# Patient Record
Sex: Female | Born: 1990 | Race: White | Hispanic: No | Marital: Married | Smoking: Current every day smoker
Health system: Southern US, Community
[De-identification: ages and names within clinical notes are randomized; demographics above are authoritative.]

## PROBLEM LIST (undated history)

## (undated) DIAGNOSIS — N750 Cyst of Bartholin's gland: Secondary | ICD-10-CM

## (undated) DIAGNOSIS — Z8709 Personal history of other diseases of the respiratory system: Secondary | ICD-10-CM

## (undated) DIAGNOSIS — J45909 Unspecified asthma, uncomplicated: Secondary | ICD-10-CM

## (undated) HISTORY — PX: TONSILLECTOMY: SUR1361

---

## 2012-03-23 ENCOUNTER — Encounter: Payer: Self-pay | Admitting: Physician Assistant

## 2012-03-23 ENCOUNTER — Ambulatory Visit: Payer: Self-pay | Admitting: Physician Assistant

## 2012-03-23 ENCOUNTER — Other Ambulatory Visit: Payer: Self-pay | Admitting: Internal Medicine

## 2012-03-23 VITALS — BP 119/69 | HR 92 | Temp 98.3°F | Resp 16 | Ht 65.0 in | Wt 147.8 lb

## 2012-03-23 DIAGNOSIS — Z01419 Encounter for gynecological examination (general) (routine) without abnormal findings: Secondary | ICD-10-CM

## 2012-03-23 MED ORDER — NORGESTIM-ETH ESTRAD TRIPHASIC 0.18/0.215/0.25 MG-35 MCG PO TABS: 1.0000 | ORAL_TABLET | Freq: Every day | ORAL | Status: DC

## 2012-03-24 NOTE — Progress Notes (Signed)
  Subjective:    Patient ID: Connie Buchanan, female    DOB: [birth year], 21 y.o.   MRN: [id number]  HPI 21yo CF here for pap. No health problems.  Doesn't do SBE.   Review of Systems  All other systems reviewed and are negative.       Objective:   Physical Exam  Nursing note and vitals reviewed. Constitutional: She is oriented to person, place, and time. She appears well-developed and well-nourished.  HENT:  Head: Normocephalic and atraumatic.  Right Ear: External ear normal.  Left Ear: External ear normal.  Neck: Normal range of motion. Neck supple. No thyromegaly present.  Cardiovascular: Normal rate, regular rhythm and normal heart sounds.  Exam reveals no gallop and no friction rub.   No murmur heard. Pulmonary/Chest: Effort normal and breath sounds normal. Right breast exhibits no inverted nipple, no mass, no nipple discharge, no skin change and no tenderness. Left breast exhibits no inverted nipple, no mass, no nipple discharge, no skin change and no tenderness. Breasts are symmetrical.  Abdominal: Soft. Bowel sounds are normal.  Genitourinary: Vagina normal and uterus normal.       Pap taken, bimanual unremarkable  Lymphadenopathy:    She has no cervical adenopathy.  Neurological: She is alert and oriented to person, place, and time.  Skin: Skin is warm and dry.          Assessment & Plan:  Gyn exam-taught and encouraged SBE.  Pap#3 sent.

## 2012-03-28 LAB — PAP IG, CT-NG, RFX HPV ASCU: GC Probe Amp: NEGATIVE

## 2013-05-24 ENCOUNTER — Other Ambulatory Visit: Payer: Self-pay | Admitting: Physician Assistant

## 2013-06-16 ENCOUNTER — Ambulatory Visit (INDEPENDENT_AMBULATORY_CARE_PROVIDER_SITE_OTHER): Payer: PRIVATE HEALTH INSURANCE | Admitting: Internal Medicine

## 2013-06-16 VITALS — BP 110/68 | HR 71 | Temp 98.0°F | Resp 16 | Ht 65.0 in | Wt 133.0 lb

## 2013-06-16 DIAGNOSIS — Z0000 Encounter for general adult medical examination without abnormal findings: Secondary | ICD-10-CM

## 2013-06-16 DIAGNOSIS — Z309 Encounter for contraceptive management, unspecified: Secondary | ICD-10-CM

## 2013-06-16 MED ORDER — NORGESTIM-ETH ESTRAD TRIPHASIC 0.18/0.215/0.25 MG-35 MCG PO TABS: 1.0000 | ORAL_TABLET | Freq: Every day | ORAL | Status: DC

## 2013-06-16 NOTE — Progress Notes (Signed)
  Subjective:    Patient ID: Connie Buchanan, female    DOB: 06-21-1991, 22 y.o.   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 No health issues Immunizations up to date Smokes occas/no illegal drugs Has had 2 normal Pap smears in the last 4 years Good relationship with parents  Family history father with obesity related diabetes Grandmother with breast cancer  She says her immunizations are up to date  Review of Systems Review of systems negative except occasional headaches which are monthly or less and do not interfere with activity/no vision changes    Objective:   Physical Exam BP 110/68  Pulse 71  Temp(Src) 98 F (36.7 C) (Oral)  Resp 16  Ht 5\' 5"  (1.651 m)  Wt 133 lb (60.328 kg)  BMI 22.13 kg/m2  SpO2 100%  LMP 05/28/2013 HEENT clear Heart regular Lungs clear Abdomen supple without organomegaly Extremities clear Skin clear except left axilla has pedunculated nevus Neurological clear Mood good affect appropriate     Assessment & Plan:  Annual health assessment is stable She is advised to discontinue smoking She does not need another Pap smear until 2016 She can continue birth control pills She is afraid to have the mole removed She does not wish to have blood work done

## 2014-05-16 ENCOUNTER — Other Ambulatory Visit: Payer: Self-pay | Admitting: Internal Medicine

## 2014-05-25 ENCOUNTER — Ambulatory Visit (INDEPENDENT_AMBULATORY_CARE_PROVIDER_SITE_OTHER): Payer: No Typology Code available for payment source | Admitting: Emergency Medicine

## 2014-05-25 VITALS — BP 116/68 | HR 80 | Temp 98.3°F | Resp 18 | Ht 64.0 in | Wt 146.2 lb

## 2014-05-25 DIAGNOSIS — M2390 Unspecified internal derangement of unspecified knee: Secondary | ICD-10-CM

## 2014-05-25 MED ORDER — NAPROXEN SODIUM 550 MG PO TABS: 550.0000 mg | ORAL_TABLET | Freq: Two times a day (BID) | ORAL | Status: AC

## 2014-05-25 MED ORDER — ACETAMINOPHEN-CODEINE #3 300-30 MG PO TABS: 1.0000 | ORAL_TABLET | ORAL | Status: DC | PRN

## 2014-05-25 NOTE — Progress Notes (Signed)
[facility] [address], [place] [address] [phone]  Date:  05/25/2014   Name:  Connie SpruceCaitlin Buchanan   DOB:  10/21/1991   MRN:  [id number]  PCP:  No PCP Per Patient    Chief Complaint: Knee Pain   History of Present Illness:  Connie SpruceCaitlin Lovick is a 23 y.o. very pleasant female patient who presents with the following:  History of long term pain in right knee under care of ortho.  She went tubing yesterday and after twisted her knee to "pop" it and relieve pressure and now it  Won't "pop" back to it's "normal" position.   She is unable to pivot on that foot and cannot flex her knee.  She has moderate swelling and is having pain.  No improvement with over the counter medications or other home remedies. Denies other complaint or health concern today.   There are no active problems to display for this patient.   History reviewed. No pertinent past medical history.  Past Surgical History  Procedure Laterality Date  . Tonsillectomy      History  Substance Use Topics  . Smoking status: Current Some Day Smoker  . Smokeless tobacco: Not on file  . Alcohol Use: No    History reviewed. No pertinent family history.  No Known Allergies  Medication list has been reviewed and updated.  Current Outpatient Prescriptions on File Prior to Visit  Medication Sig Dispense Refill  . Norgestimate-Ethinyl Estradiol Triphasic (TRI-SPRINTEC) 0.18/0.215/0.25 MG-35 MCG tablet Take 1 tablet by mouth daily. PATIENT NEEDS OFFICE VISIT FOR ADDITIONAL REFILLS  28 tablet  0   No current facility-administered medications on file prior to visit.    Review of Systems:  As per HPI, otherwise negative.    Physical Examination: Filed Vitals:   05/25/14 1651  BP: 116/68  Pulse: 80  Temp: 98.3 F (36.8 C)  Resp: 18   Filed Vitals:   05/25/14 1651  Height: 5\' 4"  (1.626 m)  Weight: 146 lb 3.2 oz (66.316 kg)   Body mass index is 25.08 kg/(m^2). Ideal Body Weight: Weight  in (lb) to have BMI = 25: 145.3   GEN: WDWN, NAD, Non-toxic, Alert & Oriented x 3 HEENT: Atraumatic, Normocephalic.  Ears and Nose: No external deformity. EXTR: No clubbing/cyanosis/edema NEURO: Normal gait.  PSYCH: Normally interactive. Conversant. Not depressed or anxious appearing.  Calm demeanor.  RIGHT knee: moderate effusion.  Tender medial knee.  Too guardy to evaluate stability.  Full extension; 75 degrees of flexion.  Assessment and Plan: Internal derangement knee Crutches Knee immobilizer MRI Ortho follow up  tyl #3 anaprox Signed,  Phillips OdorJeffery Jagger Demonte, MD

## 2014-05-25 NOTE — Patient Instructions (Signed)

## 2014-06-14 ENCOUNTER — Inpatient Hospital Stay: Admission: RE | Admit: 2014-06-14 | Payer: Self-pay | Source: Ambulatory Visit

## 2014-06-20 ENCOUNTER — Other Ambulatory Visit: Payer: Self-pay | Admitting: Physician Assistant

## 2014-06-20 ENCOUNTER — Other Ambulatory Visit: Payer: Self-pay | Admitting: Internal Medicine

## 2014-07-17 ENCOUNTER — Other Ambulatory Visit: Payer: Self-pay | Admitting: Physician Assistant

## 2014-07-20 ENCOUNTER — Ambulatory Visit (INDEPENDENT_AMBULATORY_CARE_PROVIDER_SITE_OTHER): Payer: No Typology Code available for payment source | Admitting: Internal Medicine

## 2014-07-20 VITALS — BP 110/70 | HR 85 | Temp 98.2°F | Resp 16 | Ht 65.5 in | Wt 147.0 lb

## 2014-07-20 DIAGNOSIS — Z3041 Encounter for surveillance of contraceptive pills: Secondary | ICD-10-CM

## 2014-07-20 DIAGNOSIS — Z3009 Encounter for other general counseling and advice on contraception: Secondary | ICD-10-CM

## 2014-07-20 MED ORDER — NORGESTIM-ETH ESTRAD TRIPHASIC 0.18/0.215/0.25 MG-35 MCG PO TABS: 1.0000 | ORAL_TABLET | Freq: Every day | ORAL | Status: DC

## 2014-07-20 NOTE — Progress Notes (Signed)
   Subjective:  This chart was scribed for Ellamae Siaobert Melaine Mcphee, MD by Bronson CurbJacqueline Melvin, ED Scribe. This patient was seen in room Room/bed 14 and the patient's care was started at 10:58 AM.   Patient ID: Connie Buchanan, female    DOB: 05/13/1991, 23 y.o.   MRN: [id number]  HPI  HPI Comments: Connie Buchanan is a 23 y.o. female who [facility] on [facility] pills. Patient states she has a new sexual partner, and reports using female condoms in addition to her  birth control pills. She states she has had two partners in this past year. Patient does not wish to be screened for chlamydia/gonorrhea. Unsure HPV//next pap 2016  Review of Systems  Constitutional: Negative for fever and chills.       Objective:   Physical Exam  Nursing note and vitals reviewed. Constitutional: She is oriented to person, place, and time. She appears well-developed and well-nourished. No distress.  HENT:  Head: Normocephalic and atraumatic.  Eyes: Conjunctivae and EOM are normal.  Neck: Neck supple. No tracheal deviation present.  Cardiovascular: Normal rate.   Pulmonary/Chest: Effort normal. No respiratory distress.  Musculoskeletal: Normal range of motion.  Neurological: She is alert and oriented to person, place, and time.  Skin: Skin is warm and dry.  Psychiatric: She has a normal mood and affect. Her behavior is normal.          Assessment & Plan:  Encounter for other general counseling or advice on contraception  Encounter for birth control pills maintenance  Meds ordered this encounter  Medications  . Norgestimate-Ethinyl Estradiol Triphasic (TRI-SPRINTEC) 0.18/0.215/0.25 MG-35 MCG tablet    Sig: Take 1 tablet by mouth daily.    Dispense:  1 Package    Refill:  11      I have completed the patient encounter in its entirety as documented by the scribe, with editing by me where necessary. Arad Burston P. Merla Richesoolittle, M.D.

## 2015-05-25 ENCOUNTER — Encounter (HOSPITAL_COMMUNITY): Payer: Self-pay | Admitting: *Deleted

## 2015-05-25 DIAGNOSIS — Y92410 Unspecified street and highway as the place of occurrence of the external cause: Secondary | ICD-10-CM | POA: Insufficient documentation

## 2015-05-25 DIAGNOSIS — S0990XA Unspecified injury of head, initial encounter: Secondary | ICD-10-CM | POA: Insufficient documentation

## 2015-05-25 DIAGNOSIS — Y9389 Activity, other specified: Secondary | ICD-10-CM | POA: Insufficient documentation

## 2015-05-25 DIAGNOSIS — Z793 Long term (current) use of hormonal contraceptives: Secondary | ICD-10-CM | POA: Insufficient documentation

## 2015-05-25 DIAGNOSIS — Y998 Other external cause status: Secondary | ICD-10-CM | POA: Diagnosis not present

## 2015-05-25 DIAGNOSIS — Z720 Tobacco use: Secondary | ICD-10-CM | POA: Diagnosis not present
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
## 2015-05-26 ENCOUNTER — Emergency Department (HOSPITAL_COMMUNITY)
Admission: EM | Admit: 2015-05-26 | Discharge: 2015-05-26 | Disposition: A | Discharge status: Home / Self Care | Payer: No Typology Code available for payment source | Attending: Emergency Medicine | Admitting: Emergency Medicine

## 2015-05-26 MED ORDER — IBUPROFEN 400 MG PO TABS
800.0000 mg | ORAL_TABLET | Freq: Once | ORAL | Status: AC
  Administered 2015-05-26: 800 mg via ORAL
  Filled 2015-05-26: qty 2

## 2015-05-26 MED ORDER — IBUPROFEN 800 MG PO TABS: 800.0000 mg | ORAL_TABLET | Freq: Three times a day (TID) | ORAL | Status: DC

## 2015-05-26 NOTE — Discharge Instructions (Signed)
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
## 2015-05-31 ENCOUNTER — Ambulatory Visit (INDEPENDENT_AMBULATORY_CARE_PROVIDER_SITE_OTHER): Payer: No Typology Code available for payment source | Admitting: Emergency Medicine

## 2015-05-31 VITALS — BP 118/62 | HR 95 | Temp 98.6°F | Resp 18 | Ht 66.0 in | Wt 153.0 lb

## 2015-05-31 DIAGNOSIS — G44209 Tension-type headache, unspecified, not intractable: Secondary | ICD-10-CM | POA: Diagnosis not present

## 2015-05-31 LAB — POCT URINE PREGNANCY: Preg Test, Ur: NEGATIVE

## 2015-05-31 MED ORDER — CYCLOBENZAPRINE HCL 5 MG PO TABS: 5.0000 mg | ORAL_TABLET | Freq: Every evening | ORAL | Status: DC | PRN

## 2015-05-31 MED ORDER — NAPROXEN 500 MG PO TABS: 500.0000 mg | ORAL_TABLET | Freq: Two times a day (BID) | ORAL | Status: DC
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
## 2015-06-05 ENCOUNTER — Other Ambulatory Visit: Payer: No Typology Code available for payment source

## 2015-06-13 ENCOUNTER — Ambulatory Visit (INDEPENDENT_AMBULATORY_CARE_PROVIDER_SITE_OTHER): Payer: No Typology Code available for payment source | Admitting: Family Medicine

## 2015-06-13 VITALS — BP 118/68 | HR 88 | Temp 98.8°F | Resp 18 | Ht 64.5 in | Wt 149.6 lb

## 2015-06-13 DIAGNOSIS — Z3041 Encounter for surveillance of contraceptive pills: Secondary | ICD-10-CM

## 2015-06-13 DIAGNOSIS — Z124 Encounter for screening for malignant neoplasm of cervix: Secondary | ICD-10-CM | POA: Diagnosis not present

## 2015-06-13 DIAGNOSIS — Z720 Tobacco use: Secondary | ICD-10-CM | POA: Insufficient documentation

## 2015-06-13 MED ORDER — NORGESTIM-ETH ESTRAD TRIPHASIC 0.18/0.215/0.25 MG-35 MCG PO TABS: 1.0000 | ORAL_TABLET | Freq: Every day | ORAL | Status: DC

## 2015-06-13 NOTE — Patient Instructions (Signed)
Good to see you today! I will be in touch with your pap asap Continue to take your pill daily Remember that smoking while on birth control pills can increase your risk of a stroke or a blood clot.   Please try to quit smoking.   If you are not able to quit you may want to consider a progesterone only method such as nexplanon, the IUD or a progesterone only pill

## 2015-06-13 NOTE — Progress Notes (Signed)
[facility] [address], [place] [address] [phone]  Date:  06/13/2015   Name:  Connie Buchanan   DOB:  08/11/1991   MRN:  [id number]  PCP:  No PCP Per Patient    Chief Complaint: Gynecologic Exam   History of Present Illness:  Connie Buchanan is a 24 y.o. very pleasant female patient who presents with the following:  Generally healthy young lady here today to discuss birth control Last pap about 3 years ago- no history of abnormal She is ok with her current OCP She is an occasional smoker- she might smoke 1 pack a week or a pack a day No history of DVT or PE, no history of cancer.   She does not get migraine headache   There are no active problems to display for this patient.   History reviewed. No pertinent past medical history.  Past Surgical History  Procedure Laterality Date  . Tonsillectomy      History  Substance Use Topics  . Smoking status: Current Some Day Smoker  . Smokeless tobacco: Not on file  . Alcohol Use: No    History reviewed. No pertinent family history.  No Known Allergies  Medication list has been reviewed and updated.  Current Outpatient Prescriptions on File Prior to Visit  Medication Sig Dispense Refill  . acetaminophen-codeine (TYLENOL #3) 300-30 MG per tablet Take 1-2 tablets by mouth every 4 (four) hours as needed. 30 tablet 0  . cyclobenzaprine (FLEXERIL) 5 MG tablet Take 1 tablet (5 mg total) by mouth at bedtime as needed for muscle spasms. 30 tablet 0  . naproxen (NAPROSYN) 500 MG tablet Take 1 tablet (500 mg total) by mouth 2 (two) times daily with a meal. 30 tablet 0  . Norgestimate-Ethinyl Estradiol Triphasic (TRI-SPRINTEC) 0.18/0.215/0.25 MG-35 MCG tablet Take 1 tablet by mouth daily. 1 Package 11  . ibuprofen (ADVIL,MOTRIN) 800 MG tablet Take 1 tablet (800 mg total) by mouth 3 (three) times daily. (Patient not taking: Reported on 05/31/2015) 21 tablet 0   No current facility-administered  medications on file prior to visit.    Review of Systems:  As per HPI- otherwise negative.   Physical Examination: Filed Vitals:   06/13/15 1514  BP: 118/68  Pulse: 88  Temp: 98.8 F (37.1 C)  Resp: 18   Filed Vitals:   06/13/15 1514  Height: 5' 4.5" (1.638 m)  Weight: 149 lb 9.6 oz (67.858 kg)   Body mass index is 25.29 kg/(m^2). Ideal Body Weight: Weight in (lb) to have BMI = 25: 147.6  GEN: WDWN, NAD, Non-toxic, A & O x 3,looks well HEENT: Atraumatic, Normocephalic. Neck supple. No masses, No LAD. Ears and Nose: No external deformity. CV: RRR, No M/G/R. No JVD. No thrill. No extra heart sounds. PULM: CTA B, no wheezes, crackles, rhonchi. No retractions. No resp. distress. No accessory muscle use. ABD: S, NT, ND EXTR: No c/c/e NEURO Normal gait.  PSYCH: Normally interactive. Conversant. Not depressed or anxious appearing.  Calm demeanor.  Pelvic: normal, no vaginal lesions or discharge. Uterus normal, no CMT, no adnexal tendereness or masses    Assessment and Plan: Screening for cervical cancer - Plan: Pap IG, CT/NG w/ reflex HPV when ASC-U  Oral contraceptive pill surveillance - Plan: Norgestimate-Ethinyl Estradiol Triphasic (TRI-SPRINTEC) 0.18/0.215/0.25 MG-35 MCG tablet  Refilled her OCP, discussed increased risk of CVA/ DVT with tobacco use and encouraged her to quit   Signed Abbe AmsterdamJessica Erna Brossard, MD

## 2015-06-15 LAB — PAP IG, CT-NG, RFX HPV ASCU
CHLAMYDIA PROBE AMP: NEGATIVE
GC Probe Amp: NEGATIVE

## 2015-06-16 ENCOUNTER — Encounter: Payer: Self-pay | Admitting: Family Medicine

## 2016-06-18 ENCOUNTER — Other Ambulatory Visit: Payer: Self-pay | Admitting: Family Medicine

## 2016-06-19 NOTE — Telephone Encounter (Signed)
Tried to contact pt to inform her that her Tri-Sprintec rx has been filled but she will need to make an appt prior to any further refills. Left message for pt to call back to schedule appt.

## 2017-11-09 LAB — OB RESULTS CONSOLE GC/CHLAMYDIA
CHLAMYDIA, DNA PROBE: NEGATIVE
GC PROBE AMP, GENITAL: NEGATIVE

## 2017-11-09 LAB — OB RESULTS CONSOLE HIV ANTIBODY (ROUTINE TESTING): HIV: NONREACTIVE

## 2017-11-09 LAB — OB RESULTS CONSOLE ABO/RH: RH Type: POSITIVE

## 2017-11-09 LAB — OB RESULTS CONSOLE RUBELLA ANTIBODY, IGM: RUBELLA: IMMUNE

## 2017-11-09 LAB — OB RESULTS CONSOLE RPR: RPR: NONREACTIVE

## 2017-11-09 LAB — OB RESULTS CONSOLE HEPATITIS B SURFACE ANTIGEN: HEP B S AG: NEGATIVE

## 2017-11-09 LAB — OB RESULTS CONSOLE ANTIBODY SCREEN: Antibody Screen: NEGATIVE

## 2017-11-18 ENCOUNTER — Other Ambulatory Visit (HOSPITAL_COMMUNITY)
Admission: RE | Admit: 2017-11-18 | Discharge: 2017-11-18 | Disposition: A | Discharge status: Home / Self Care | Payer: Managed Care, Other (non HMO) | Source: Ambulatory Visit | Attending: Obstetrics and Gynecology | Admitting: Obstetrics and Gynecology

## 2017-11-18 ENCOUNTER — Other Ambulatory Visit: Payer: Self-pay | Admitting: Obstetrics and Gynecology

## 2017-11-18 DIAGNOSIS — Z124 Encounter for screening for malignant neoplasm of cervix: Secondary | ICD-10-CM | POA: Diagnosis not present

## 2017-11-19 LAB — CYTOLOGY - PAP
Chlamydia: NEGATIVE
DIAGNOSIS: NEGATIVE
NEISSERIA GONORRHEA: NEGATIVE

## 2018-05-16 LAB — OB RESULTS CONSOLE GBS: GBS: NEGATIVE

## 2018-06-14 ENCOUNTER — Telehealth (HOSPITAL_COMMUNITY): Payer: Self-pay | Admitting: *Deleted

## 2018-06-14 ENCOUNTER — Encounter (HOSPITAL_COMMUNITY): Payer: Self-pay | Admitting: *Deleted

## 2018-06-15 NOTE — Telephone Encounter (Signed)
Preadmission screen  

## 2018-06-16 ENCOUNTER — Telehealth (HOSPITAL_COMMUNITY): Payer: Self-pay | Admitting: *Deleted

## 2018-06-16 ENCOUNTER — Encounter (HOSPITAL_COMMUNITY): Payer: Self-pay | Admitting: *Deleted

## 2018-06-16 NOTE — Telephone Encounter (Signed)
Preadmission screen  

## 2018-06-17 ENCOUNTER — Inpatient Hospital Stay (HOSPITAL_COMMUNITY)
Admission: AD | Admit: 2018-06-17 | Discharge: 2018-06-21 | DRG: 788 | Disposition: A | Discharge status: Home / Self Care | Payer: Managed Care, Other (non HMO) | Attending: Obstetrics and Gynecology | Admitting: Obstetrics and Gynecology

## 2018-06-17 ENCOUNTER — Other Ambulatory Visit: Payer: Self-pay

## 2018-06-17 ENCOUNTER — Inpatient Hospital Stay (HOSPITAL_COMMUNITY): Payer: Managed Care, Other (non HMO) | Admitting: Anesthesiology

## 2018-06-17 ENCOUNTER — Encounter (HOSPITAL_COMMUNITY): Payer: Self-pay | Admitting: *Deleted

## 2018-06-17 DIAGNOSIS — O99334 Smoking (tobacco) complicating childbirth: Secondary | ICD-10-CM | POA: Diagnosis present

## 2018-06-17 DIAGNOSIS — F17210 Nicotine dependence, cigarettes, uncomplicated: Secondary | ICD-10-CM | POA: Diagnosis present

## 2018-06-17 DIAGNOSIS — O4292 Full-term premature rupture of membranes, unspecified as to length of time between rupture and onset of labor: Principal | ICD-10-CM | POA: Diagnosis present

## 2018-06-17 DIAGNOSIS — O324XX0 Maternal care for high head at term, not applicable or unspecified: Secondary | ICD-10-CM | POA: Diagnosis present

## 2018-06-17 DIAGNOSIS — Z3A40 40 weeks gestation of pregnancy: Secondary | ICD-10-CM | POA: Diagnosis not present

## 2018-06-17 HISTORY — DX: Unspecified asthma, uncomplicated: J45.909

## 2018-06-17 LAB — TYPE AND SCREEN
ABO/RH(D): A POS
ANTIBODY SCREEN: NEGATIVE

## 2018-06-17 LAB — CBC
HCT: 39.1 % (ref 36.0–46.0)
Hemoglobin: 13.4 g/dL (ref 12.0–15.0)
MCH: 30.7 pg (ref 26.0–34.0)
MCHC: 34.3 g/dL (ref 30.0–36.0)
MCV: 89.7 fL (ref 78.0–100.0)
Platelets: 302 10*3/uL (ref 150–400)
RBC: 4.36 MIL/uL (ref 3.87–5.11)
RDW: 13.7 % (ref 11.5–15.5)
WBC: 15.8 10*3/uL — AB (ref 4.0–10.5)

## 2018-06-17 LAB — ABO/RH: ABO/RH(D): A POS

## 2018-06-17 LAB — POCT FERN TEST: POCT Fern Test: POSITIVE

## 2018-06-17 MED ORDER — PHENYLEPHRINE 40 MCG/ML (10ML) SYRINGE FOR IV PUSH (FOR BLOOD PRESSURE SUPPORT): 80.0000 ug | PREFILLED_SYRINGE | INTRAVENOUS | Status: DC | PRN

## 2018-06-17 MED ORDER — LIDOCAINE HCL (PF) 1 % IJ SOLN
30.0000 mL | INTRAMUSCULAR | Status: DC | PRN
  Filled 2018-06-17: qty 30

## 2018-06-17 MED ORDER — EPHEDRINE 5 MG/ML INJ: 10.0000 mg | INTRAVENOUS | Status: DC | PRN

## 2018-06-17 MED ORDER — ONDANSETRON HCL 4 MG/2ML IJ SOLN
4.0000 mg | Freq: Four times a day (QID) | INTRAMUSCULAR | Status: DC | PRN
  Administered 2018-06-17: 4 mg via INTRAVENOUS
  Filled 2018-06-17: qty 2

## 2018-06-17 MED ORDER — ACETAMINOPHEN 325 MG PO TABS: 650.0000 mg | ORAL_TABLET | ORAL | Status: DC | PRN

## 2018-06-17 MED ORDER — FENTANYL CITRATE (PF) 100 MCG/2ML IJ SOLN
50.0000 ug | INTRAMUSCULAR | Status: DC | PRN
Start: 2018-06-17 — End: 2018-06-18
  Administered 2018-06-17: 100 ug via INTRAVENOUS
  Filled 2018-06-17: qty 2

## 2018-06-17 MED ORDER — OXYCODONE-ACETAMINOPHEN 5-325 MG PO TABS: 2.0000 | ORAL_TABLET | ORAL | Status: DC | PRN

## 2018-06-17 MED ORDER — LACTATED RINGERS IV SOLN
500.0000 mL | Freq: Once | INTRAVENOUS | Status: AC
  Administered 2018-06-17: 500 mL via INTRAVENOUS

## 2018-06-17 MED ORDER — TERBUTALINE SULFATE 1 MG/ML IJ SOLN: 0.2500 mg | Freq: Once | INTRAMUSCULAR | Status: DC | PRN

## 2018-06-17 MED ORDER — LIDOCAINE HCL (PF) 1 % IJ SOLN
INTRAMUSCULAR | Status: DC | PRN
  Administered 2018-06-17: 13 mL via EPIDURAL

## 2018-06-17 MED ORDER — DIPHENHYDRAMINE HCL 50 MG/ML IJ SOLN: 12.5000 mg | INTRAMUSCULAR | Status: DC | PRN

## 2018-06-17 MED ORDER — OXYTOCIN 40 UNITS IN LACTATED RINGERS INFUSION - SIMPLE MED: 2.5000 [IU]/h | INTRAVENOUS | Status: DC

## 2018-06-17 MED ORDER — FLEET ENEMA 7-19 GM/118ML RE ENEM: 1.0000 | ENEMA | RECTAL | Status: DC | PRN

## 2018-06-17 MED ORDER — OXYTOCIN 40 UNITS IN LACTATED RINGERS INFUSION - SIMPLE MED
1.0000 m[IU]/min | INTRAVENOUS | Status: DC
  Administered 2018-06-17: 2 m[IU]/min via INTRAVENOUS
  Filled 2018-06-17: qty 1000

## 2018-06-17 MED ORDER — LACTATED RINGERS IV SOLN: 500.0000 mL | INTRAVENOUS | Status: DC | PRN

## 2018-06-17 MED ORDER — OXYCODONE-ACETAMINOPHEN 5-325 MG PO TABS: 1.0000 | ORAL_TABLET | ORAL | Status: DC | PRN

## 2018-06-17 MED ORDER — MISOPROSTOL 50MCG HALF TABLET
50.0000 ug | ORAL_TABLET | Freq: Once | ORAL | Status: AC
  Administered 2018-06-17: 50 ug via ORAL
  Filled 2018-06-17: qty 1

## 2018-06-17 MED ORDER — FENTANYL 2.5 MCG/ML BUPIVACAINE 1/10 % EPIDURAL INFUSION (WH - ANES)
14.0000 mL/h | INTRAMUSCULAR | Status: DC | PRN
  Administered 2018-06-17 (×2): 14 mL/h via EPIDURAL
  Filled 2018-06-17 (×2): qty 100

## 2018-06-17 MED ORDER — PHENYLEPHRINE 40 MCG/ML (10ML) SYRINGE FOR IV PUSH (FOR BLOOD PRESSURE SUPPORT)
80.0000 ug | PREFILLED_SYRINGE | INTRAVENOUS | Status: DC | PRN
  Filled 2018-06-17: qty 10

## 2018-06-17 MED ORDER — LACTATED RINGERS IV SOLN
INTRAVENOUS | Status: DC
  Administered 2018-06-17: 1000 mL via INTRAVENOUS
  Administered 2018-06-17 – 2018-06-18 (×2): via INTRAVENOUS

## 2018-06-17 MED ORDER — OXYTOCIN BOLUS FROM INFUSION: 500.0000 mL | Freq: Once | INTRAVENOUS | Status: DC

## 2018-06-17 MED ORDER — SOD CITRATE-CITRIC ACID 500-334 MG/5ML PO SOLN
30.0000 mL | ORAL | Status: DC | PRN
  Administered 2018-06-18: 30 mL via ORAL
  Filled 2018-06-17: qty 15

## 2018-06-17 NOTE — Anesthesia Preprocedure Evaluation (Addendum)
Anesthesia Evaluation  Patient identified by MRN, date of birth, ID band Patient awake    Reviewed: Allergy & Precautions, NPO status , Patient's Chart, lab work & pertinent test results  Airway Mallampati: II  TM Distance: >3 FB Neck ROM: Full    Dental no notable dental hx. (+) Teeth Intact   Pulmonary asthma , Current Smoker,    Pulmonary exam normal breath sounds clear to auscultation       Cardiovascular negative cardio ROS Normal cardiovascular exam Rhythm:Regular Rate:Normal     Neuro/Psych negative neurological ROS  negative psych ROS   GI/Hepatic negative GI ROS, Neg liver ROS,   Endo/Other  negative endocrine ROS  Renal/GU negative Renal ROS  negative genitourinary   Musculoskeletal negative musculoskeletal ROS (+)   Abdominal (+) + obese,   Peds negative pediatric ROS (+)  Hematology negative hematology ROS (+)   Anesthesia Other Findings   Reproductive/Obstetrics negative OB ROS (+) Pregnancy                            Anesthesia Physical Anesthesia Plan  ASA: II and emergent  Anesthesia Plan: Epidural   Post-op Pain Management:    Induction:   PONV Risk Score and Plan: 3 and Ondansetron, Dexamethasone, Scopolamine patch - Pre-op and Treatment may vary due to age or medical condition  Airway Management Planned: Natural Airway  Additional Equipment:   Intra-op Plan:   Post-operative Plan:   Informed Consent: I have reviewed the patients History and Physical, chart, labs and discussed the procedure including the risks, benefits and alternatives for the proposed anesthesia with the patient or authorized representative who has indicated his/her understanding and acceptance.   Dental advisory given  Plan Discussed with: CRNA and Surgeon  Anesthesia Plan Comments: (Patient for urgent C/Section for failure to progress and failed vacuum. Will use epidural for  C/Section. M. Malen GauzeFoster, MD)       Anesthesia Quick Evaluation

## 2018-06-17 NOTE — MAU Provider Note (Signed)
[name] is a 27 y.o. G1P0 at 7379w2d gestation presents to MAU for SROM @ 0545. Requested by RN to verify presentation with informal bedside U/S. Pt informed that the ultrasound is considered a limited OB ultrasound and is not intended to be a complete ultrasound exam.  Patient also informed that the ultrasound is not being completed with the intent of assessing for fetal or placental anomalies or any pelvic abnormalities.  Explained that the purpose of today's ultrasound is to assess for  presentation - cephalic presentation on scan.  Patient acknowledges the purpose of the exam and the limitations of the study.     Raelyn MoraRolitta Nydia Ytuarte, CNM  06/17/2018 9:22 AM

## 2018-06-17 NOTE — Progress Notes (Signed)
   Subjective: Patient is comfortable with her epidural   Objective: BP 113/67   Pulse 85   Temp 98.4 F (36.9 C) (Oral)   Resp 20   Ht 5\' 6"  (1.676 m)   Wt 85.7 kg (189 lb)   LMP 09/08/2017   SpO2 96%   BMI 30.51 kg/m  No intake/output data recorded. Total I/O In: -  Out: 150 [Urine:150]  FHT:  FHR: 130 bpm, variability: moderate,  accelerations:  Present,  decelerations:  Present early UC:   regular, every 2 minutes SVE:   Dilation: 4 Effacement (%): 90 Station: 0 Exam by:: Valentina Lucks. Woods, RN  Labs: Lab Results  Component Value Date   WBC 15.8 (H) 06/17/2018   HGB 13.4 06/17/2018   HCT 39.1 06/17/2018   MCV 89.7 06/17/2018   PLT 302 06/17/2018    Assessment / Plan: Augmentation of labor, progressing well  Labor: Progressing normally Preeclampsia:  NA Fetal Wellbeing:  Category I and Category II Pain Control:  Epidural I/D:  n/a Anticipated MOD:  NSVD  Connie Buchanan J. 06/17/2018, 5:21 PM

## 2018-06-17 NOTE — Anesthesia Pain Management Evaluation Note (Signed)
  CRNA Pain Management Visit Note  Patient: Connie SpruceCaitlin Arata, 27 y.o., female  "Hello I am a member of the anesthesia team at [facility]. We have an anesthesia team available at all times to provide care throughout the hospital, including epidural management and anesthesia for C-section. I don't know your plan for the delivery whether it a natural birth, water birth, IV sedation, nitrous supplementation, doula or epidural, but we want to meet your pain goals."   1.Was your pain managed to your expectations on prior hospitalizations?   No prior hospitalizations  2.What is your expectation for pain management during this hospitalization?     Epidural  3.How can we help you reach that goal? unsure  Record the patient's initial score and the patient's pain goal.   Pain: 4  Pain Goal: 7 [facility] wants you to be able to say your pain was always managed very well.  Cephus ShellingBURGER,Antavion Bartoszek 06/17/2018

## 2018-06-17 NOTE — Progress Notes (Signed)
   Subjective: Patient is comfortable with her epidural   Objective: BP 122/63   Pulse 83   Temp 98.7 F (37.1 C) (Axillary)   Resp (!) 166   Ht 5\' 6"  (1.676 m)   Wt 85.7 kg (189 lb)   LMP 09/08/2017   SpO2 96%   BMI 30.51 kg/m  I/O last 3 completed shifts: In: -  Out: 150 [Urine:150] No intake/output data recorded.  FHT:  FHR: 120 bpm, variability: moderate, +accels, no decels UC:   q2-783min SVE:   Dilation: 10 Effacement (%): 100 Station: 0 Exam by:: Carlina Derks  Labs: Lab Results  Component Value Date   WBC 15.8 (H) 06/17/2018   HGB 13.4 06/17/2018   HCT 39.1 06/17/2018   MCV 89.7 06/17/2018   PLT 302 06/17/2018    Assessment / Plan: Augmentation of labor, progressing well  Labor: Progressing normally Preeclampsia:  NA Fetal Wellbeing:  Category I  Pain Control:  Epidural I/D:  n/a Anticipated MOD:  NSVD  Sharon SellerJennifer M Anaily Ashbaugh 06/17/2018, 9:28 PM

## 2018-06-17 NOTE — H&P (Signed)
Connie SpruceCaitlin Bouchard is a 27 y.o. female G1P0 at 40 wks and 2 days  presenting for SROM at 430 AM clear fluid. She reports rare contractions. +FM  No vaginal bleeding. Pregnancy has been on complicated.  Prenatal care provided by Dr. Gerald Leitzara Gabrial Domine with [facility]   . OB History    Gravida  1   Para      Term      Preterm      AB      Living        SAB      TAB      Ectopic      Multiple      Live Births             Past Medical History:  Diagnosis Date  . Asthma    as child   Past Surgical History:  Procedure Laterality Date  . TONSILLECTOMY     Family History: family history includes Alcohol abuse in her brother and father; Asthma in her mother; Breast cancer in her maternal grandmother; Depression in her mother; Diabetes in her father and mother; Hypertension in her father and mother; Lung cancer in her maternal grandmother. Social History:  reports that she has been smoking cigarettes.  She has a 6.00 pack-year smoking history. She has never used smokeless tobacco. She reports that she does not drink alcohol or use drugs.     Maternal Diabetes: No Genetic Screening: Normal Maternal Ultrasounds/Referrals: Normal Fetal Ultrasounds or other Referrals:  None Maternal Substance Abuse:  Yes:  Type: Smoker Significant Maternal Medications:  None Significant Maternal Lab Results:  Lab values include: Group B Strep negative Other Comments:  None  Review of Systems  Constitutional: Negative.   HENT: Negative.   Eyes: Negative.   Respiratory: Negative.   Cardiovascular: Negative.   Gastrointestinal: Negative.   Genitourinary: Negative.   Musculoskeletal: Negative.   Skin: Negative.   Neurological: Negative.   Endo/Heme/Allergies: Negative.   Psychiatric/Behavioral: Negative.    Maternal Medical History:  Reason for admission: Rupture of membranes.   Contractions: Onset was 6-12 hours ago.   Frequency: rare.   Perceived severity is mild.    Fetal  activity: Perceived fetal activity is normal.   Last perceived fetal movement was within the past hour.    Prenatal complications: no prenatal complications Prenatal Complications - Diabetes: none.    Dilation: 1 Effacement (%): 50 Station: (out of pelvis) Exam by:: jolynn Blood pressure 115/66, pulse 76, temperature 97.7 F (36.5 C), temperature source Oral, resp. rate 18, height 5\' 6"  (1.676 m), weight 85.7 kg (189 lb), last menstrual period 09/08/2017. Maternal Exam:  Uterine Assessment: Contraction frequency is irregular.   Abdomen: Patient reports no abdominal tenderness. Fetal presentation: vertex  Introitus: Normal vulva. Normal vagina.  Amniotic fluid character: clear.  Pelvis: adequate for delivery.      Fetal Exam Fetal Monitor Review: Baseline rate: 140.  Variability: moderate (6-25 bpm).   Pattern: accelerations present and no decelerations.    Fetal State Assessment: Category I - tracings are normal.     Physical Exam  Vitals reviewed. Constitutional: She is oriented to person, place, and time. She appears well-developed and well-nourished.  HENT:  Head: Normocephalic and atraumatic.  Eyes: Pupils are equal, round, and reactive to light. Conjunctivae are normal.  Neck: Normal range of motion. Neck supple.  Cardiovascular: Normal rate and regular rhythm.  Respiratory: Effort normal and breath sounds normal.  GI: There is no tenderness.  Genitourinary: Vagina  normal.  Musculoskeletal: She exhibits no edema.  Neurological: She is alert and oriented to person, place, and time. She has normal reflexes.  Skin: Skin is warm and dry.  Psychiatric: She has a normal mood and affect.    Prenatal labs: ABO, Rh: --/--/A POS, A POS Performed at [facility], [address] [place], [place] [address]  [phone]) Antibody: NEG (07/19 0950) Rubella: Immune (12/11 0000) RPR: Nonreactive (12/11 0000)  HBsAg: Negative (12/11 0000)  HIV: Non-reactive (12/11  0000)  GBS: Negative (06/17 0000)   Assessment/Plan: 40 wks and 2 days with PROM Cytotec 50 mcg po q 4 hours for cervical ripening Continous Fetal monitoring.  Anticipate SVD.   Jaymere Alen J. 06/17/2018, 1:14 PM

## 2018-06-17 NOTE — MAU Note (Signed)
Big gush of fluid around 0430, clear fluid- still coming. No bleeding. No pain.

## 2018-06-17 NOTE — Anesthesia Procedure Notes (Signed)
Epidural Patient location during procedure: OB Start time: 06/17/2018 4:38 PM End time: 06/17/2018 4:52 PM  Staffing Anesthesiologist: Lowella CurbMiller, Kaladin Noseworthy Ray, MD Performed: anesthesiologist   Preanesthetic Checklist Completed: patient identified, site marked, surgical consent, pre-op evaluation, timeout performed, IV checked, risks and benefits discussed and monitors and equipment checked  Epidural Patient position: sitting Prep: ChloraPrep Patient monitoring: heart rate, cardiac monitor, continuous pulse ox and blood pressure Approach: midline Location: L2-L3 Injection technique: LOR saline  Needle:  Needle type: Tuohy  Needle gauge: 17 G Needle length: 9 cm Needle insertion depth: 5 cm Catheter type: closed end flexible Catheter size: 20 Guage Catheter at skin depth: 9 cm Test dose: negative  Assessment Events: blood not aspirated, injection not painful, no injection resistance, negative IV test and no paresthesia  Additional Notes Reason for block:procedure for pain

## 2018-06-18 ENCOUNTER — Encounter (HOSPITAL_COMMUNITY): Payer: Self-pay | Admitting: Obstetrics & Gynecology

## 2018-06-18 ENCOUNTER — Encounter (HOSPITAL_COMMUNITY)
Admission: AD | Disposition: A | Discharge status: Home / Self Care | Payer: Self-pay | Source: Home / Self Care | Attending: Obstetrics and Gynecology

## 2018-06-18 LAB — CBC
HCT: 31.9 % — ABNORMAL LOW (ref 36.0–46.0)
Hemoglobin: 10.9 g/dL — ABNORMAL LOW (ref 12.0–15.0)
MCH: 30.6 pg (ref 26.0–34.0)
MCHC: 34.2 g/dL (ref 30.0–36.0)
MCV: 89.6 fL (ref 78.0–100.0)
PLATELETS: 249 10*3/uL (ref 150–400)
RBC: 3.56 MIL/uL — ABNORMAL LOW (ref 3.87–5.11)
RDW: 13.5 % (ref 11.5–15.5)
WBC: 25.8 10*3/uL — AB (ref 4.0–10.5)

## 2018-06-18 LAB — RPR: RPR Ser Ql: NONREACTIVE

## 2018-06-18 SURGERY — Surgical Case
Anesthesia: Epidural | Site: Abdomen | Wound class: Clean Contaminated

## 2018-06-18 MED ORDER — PRENATAL MULTIVITAMIN CH
1.0000 | ORAL_TABLET | Freq: Every day | ORAL | Status: DC
  Administered 2018-06-18 – 2018-06-20 (×3): 1 via ORAL
  Filled 2018-06-18 (×3): qty 1

## 2018-06-18 MED ORDER — OXYCODONE HCL 5 MG PO TABS
10.0000 mg | ORAL_TABLET | ORAL | Status: DC | PRN
  Filled 2018-06-18 (×2): qty 2

## 2018-06-18 MED ORDER — SENNOSIDES-DOCUSATE SODIUM 8.6-50 MG PO TABS
2.0000 | ORAL_TABLET | ORAL | Status: DC
  Administered 2018-06-18 – 2018-06-19 (×2): 2 via ORAL
  Filled 2018-06-18 (×3): qty 2

## 2018-06-18 MED ORDER — LACTATED RINGERS IV SOLN
INTRAVENOUS | Status: DC | PRN
  Administered 2018-06-18: 02:00:00 via INTRAVENOUS

## 2018-06-18 MED ORDER — NALOXONE HCL 0.4 MG/ML IJ SOLN: 0.4000 mg | INTRAMUSCULAR | Status: DC | PRN

## 2018-06-18 MED ORDER — LACTATED RINGERS IV SOLN
INTRAVENOUS | Status: DC
  Administered 2018-06-18: 16:00:00 via INTRAVENOUS

## 2018-06-18 MED ORDER — SIMETHICONE 80 MG PO CHEW
80.0000 mg | CHEWABLE_TABLET | Freq: Three times a day (TID) | ORAL | Status: DC
  Administered 2018-06-18 – 2018-06-21 (×8): 80 mg via ORAL
  Filled 2018-06-18 (×9): qty 1

## 2018-06-18 MED ORDER — SODIUM CHLORIDE 0.9% FLUSH: 3.0000 mL | INTRAVENOUS | Status: DC | PRN

## 2018-06-18 MED ORDER — SCOPOLAMINE 1 MG/3DAYS TD PT72
MEDICATED_PATCH | TRANSDERMAL | Status: AC
  Filled 2018-06-18: qty 1

## 2018-06-18 MED ORDER — SIMETHICONE 80 MG PO CHEW
80.0000 mg | CHEWABLE_TABLET | ORAL | Status: DC
  Administered 2018-06-18 – 2018-06-20 (×3): 80 mg via ORAL
  Filled 2018-06-18 (×3): qty 1

## 2018-06-18 MED ORDER — MORPHINE SULFATE (PF) 0.5 MG/ML IJ SOLN
INTRAMUSCULAR | Status: DC | PRN
  Administered 2018-06-18: 4 mg via EPIDURAL

## 2018-06-18 MED ORDER — DEXAMETHASONE SODIUM PHOSPHATE 10 MG/ML IJ SOLN
INTRAMUSCULAR | Status: DC | PRN
  Administered 2018-06-18: 10 mg via INTRAVENOUS

## 2018-06-18 MED ORDER — SODIUM BICARBONATE 8.4 % IV SOLN
INTRAVENOUS | Status: DC | PRN
  Administered 2018-06-18: 3 mL via EPIDURAL
  Administered 2018-06-18 (×2): 5 mL via EPIDURAL

## 2018-06-18 MED ORDER — DIPHENHYDRAMINE HCL 25 MG PO CAPS
25.0000 mg | ORAL_CAPSULE | ORAL | Status: DC | PRN
  Filled 2018-06-18 (×3): qty 1

## 2018-06-18 MED ORDER — SODIUM CHLORIDE 0.9 % IR SOLN
Status: DC | PRN
  Administered 2018-06-18 (×2): 1

## 2018-06-18 MED ORDER — ONDANSETRON HCL 4 MG/2ML IJ SOLN
INTRAMUSCULAR | Status: DC | PRN
  Administered 2018-06-18: 4 mg via INTRAVENOUS

## 2018-06-18 MED ORDER — KETOROLAC TROMETHAMINE 30 MG/ML IJ SOLN: 30.0000 mg | Freq: Four times a day (QID) | INTRAMUSCULAR | Status: AC | PRN

## 2018-06-18 MED ORDER — NALBUPHINE HCL 10 MG/ML IJ SOLN
5.0000 mg | INTRAMUSCULAR | Status: DC | PRN
  Administered 2018-06-18: 5 mg via SUBCUTANEOUS

## 2018-06-18 MED ORDER — MENTHOL 3 MG MT LOZG: 1.0000 | LOZENGE | OROMUCOSAL | Status: DC | PRN

## 2018-06-18 MED ORDER — WITCH HAZEL-GLYCERIN EX PADS: 1.0000 "application " | MEDICATED_PAD | CUTANEOUS | Status: DC | PRN

## 2018-06-18 MED ORDER — OXYCODONE HCL 5 MG PO TABS
5.0000 mg | ORAL_TABLET | ORAL | Status: DC | PRN
  Administered 2018-06-19 – 2018-06-20 (×4): 5 mg via ORAL
  Filled 2018-06-18 (×4): qty 1

## 2018-06-18 MED ORDER — NALBUPHINE HCL 10 MG/ML IJ SOLN
5.0000 mg | Freq: Once | INTRAMUSCULAR | Status: DC | PRN
  Filled 2018-06-18: qty 1

## 2018-06-18 MED ORDER — MORPHINE SULFATE (PF) 0.5 MG/ML IJ SOLN
INTRAMUSCULAR | Status: AC
  Filled 2018-06-18: qty 10

## 2018-06-18 MED ORDER — SIMETHICONE 80 MG PO CHEW
80.0000 mg | CHEWABLE_TABLET | ORAL | Status: DC | PRN
  Administered 2018-06-18: 80 mg via ORAL

## 2018-06-18 MED ORDER — IBUPROFEN 600 MG PO TABS
600.0000 mg | ORAL_TABLET | Freq: Four times a day (QID) | ORAL | Status: DC
  Administered 2018-06-18 – 2018-06-21 (×12): 600 mg via ORAL
  Filled 2018-06-18 (×13): qty 1

## 2018-06-18 MED ORDER — SCOPOLAMINE 1 MG/3DAYS TD PT72
MEDICATED_PATCH | TRANSDERMAL | Status: DC | PRN
  Administered 2018-06-18: 1 via TRANSDERMAL

## 2018-06-18 MED ORDER — OXYTOCIN 40 UNITS IN LACTATED RINGERS INFUSION - SIMPLE MED: 2.5000 [IU]/h | INTRAVENOUS | Status: AC

## 2018-06-18 MED ORDER — ZOLPIDEM TARTRATE 5 MG PO TABS: 5.0000 mg | ORAL_TABLET | Freq: Every evening | ORAL | Status: DC | PRN

## 2018-06-18 MED ORDER — FENTANYL CITRATE (PF) 100 MCG/2ML IJ SOLN: 25.0000 ug | INTRAMUSCULAR | Status: DC | PRN

## 2018-06-18 MED ORDER — MEPERIDINE HCL 25 MG/ML IJ SOLN: 6.2500 mg | INTRAMUSCULAR | Status: DC | PRN

## 2018-06-18 MED ORDER — DEXAMETHASONE SODIUM PHOSPHATE 10 MG/ML IJ SOLN
INTRAMUSCULAR | Status: AC
  Filled 2018-06-18: qty 1

## 2018-06-18 MED ORDER — ACETAMINOPHEN 325 MG PO TABS
650.0000 mg | ORAL_TABLET | ORAL | Status: DC | PRN
  Administered 2018-06-19 – 2018-06-21 (×3): 650 mg via ORAL
  Filled 2018-06-18 (×2): qty 2

## 2018-06-18 MED ORDER — OXYTOCIN 10 UNIT/ML IJ SOLN
INTRAMUSCULAR | Status: AC
  Filled 2018-06-18: qty 4

## 2018-06-18 MED ORDER — DIBUCAINE 1 % RE OINT: 1.0000 "application " | TOPICAL_OINTMENT | RECTAL | Status: DC | PRN

## 2018-06-18 MED ORDER — COCONUT OIL OIL: 1.0000 "application " | TOPICAL_OIL | Status: DC | PRN

## 2018-06-18 MED ORDER — NALBUPHINE HCL 10 MG/ML IJ SOLN
5.0000 mg | INTRAMUSCULAR | Status: DC | PRN
  Administered 2018-06-18: 5 mg via INTRAVENOUS
  Filled 2018-06-18: qty 1

## 2018-06-18 MED ORDER — DIPHENHYDRAMINE HCL 25 MG PO CAPS
25.0000 mg | ORAL_CAPSULE | Freq: Four times a day (QID) | ORAL | Status: DC | PRN
Start: 2018-06-18 — End: 2018-06-21
  Administered 2018-06-18 (×2): 25 mg via ORAL

## 2018-06-18 MED ORDER — NALOXONE HCL 4 MG/10ML IJ SOLN
1.0000 ug/kg/h | INTRAVENOUS | Status: DC | PRN
  Filled 2018-06-18: qty 5

## 2018-06-18 MED ORDER — LACTATED RINGERS IV SOLN
INTRAVENOUS | Status: DC | PRN
  Administered 2018-06-18: 40 [IU] via INTRAVENOUS

## 2018-06-18 MED ORDER — KETOROLAC TROMETHAMINE 30 MG/ML IJ SOLN
INTRAMUSCULAR | Status: AC
  Administered 2018-06-18: 30 mg
  Filled 2018-06-18: qty 1

## 2018-06-18 MED ORDER — ONDANSETRON HCL 4 MG/2ML IJ SOLN
INTRAMUSCULAR | Status: AC
  Filled 2018-06-18: qty 2

## 2018-06-18 MED ORDER — NALBUPHINE HCL 10 MG/ML IJ SOLN: 5.0000 mg | Freq: Once | INTRAMUSCULAR | Status: DC | PRN

## 2018-06-18 MED ORDER — CEFAZOLIN SODIUM-DEXTROSE 2-4 GM/100ML-% IV SOLN
INTRAVENOUS | Status: AC
  Filled 2018-06-18: qty 100

## 2018-06-18 MED ORDER — CEFAZOLIN SODIUM-DEXTROSE 2-3 GM-%(50ML) IV SOLR
INTRAVENOUS | Status: DC | PRN
  Administered 2018-06-18: 2 g via INTRAVENOUS

## 2018-06-18 MED ORDER — ONDANSETRON HCL 4 MG/2ML IJ SOLN: 4.0000 mg | Freq: Three times a day (TID) | INTRAMUSCULAR | Status: DC | PRN

## 2018-06-18 MED ORDER — DIPHENHYDRAMINE HCL 50 MG/ML IJ SOLN: 12.5000 mg | INTRAMUSCULAR | Status: DC | PRN

## 2018-06-18 SURGICAL SUPPLY — 37 items
BARRIER ADHS 3X4 INTERCEED (GAUZE/BANDAGES/DRESSINGS) ×3 IMPLANT
BENZOIN TINCTURE PRP APPL 2/3 (GAUZE/BANDAGES/DRESSINGS) ×3 IMPLANT
CHLORAPREP W/TINT 26ML (MISCELLANEOUS) ×3 IMPLANT
CLAMP CORD UMBIL (MISCELLANEOUS) IMPLANT
CLOSURE WOUND 1/2 X4 (GAUZE/BANDAGES/DRESSINGS) ×1
CLOTH BEACON ORANGE TIMEOUT ST (SAFETY) ×3 IMPLANT
DERMABOND ADVANCED (GAUZE/BANDAGES/DRESSINGS)
DERMABOND ADVANCED .7 DNX12 (GAUZE/BANDAGES/DRESSINGS) IMPLANT
DRSG OPSITE POSTOP 4X10 (GAUZE/BANDAGES/DRESSINGS) ×3 IMPLANT
ELECT REM PT RETURN 9FT ADLT (ELECTROSURGICAL) ×3
ELECTRODE REM PT RTRN 9FT ADLT (ELECTROSURGICAL) ×1 IMPLANT
EXTRACTOR VACUUM KIWI (MISCELLANEOUS) IMPLANT
GLOVE BIOGEL PI IND STRL 7.0 (GLOVE) ×3 IMPLANT
GLOVE BIOGEL PI INDICATOR 7.0 (GLOVE) ×6
GLOVE ECLIPSE 6.5 STRL STRAW (GLOVE) ×3 IMPLANT
GOWN STRL REUS W/TWL LRG LVL3 (GOWN DISPOSABLE) ×9 IMPLANT
KIT ABG SYR 3ML LUER SLIP (SYRINGE) IMPLANT
NEEDLE HYPO 25X5/8 SAFETYGLIDE (NEEDLE) IMPLANT
NS IRRIG 1000ML POUR BTL (IV SOLUTION) ×3 IMPLANT
PACK C SECTION WH (CUSTOM PROCEDURE TRAY) ×3 IMPLANT
PAD ABD 7.5X8 STRL (GAUZE/BANDAGES/DRESSINGS) ×3 IMPLANT
PAD OB MATERNITY 4.3X12.25 (PERSONAL CARE ITEMS) ×3 IMPLANT
PENCIL SMOKE EVAC W/HOLSTER (ELECTROSURGICAL) ×3 IMPLANT
RTRCTR C-SECT PINK 25CM LRG (MISCELLANEOUS) ×3 IMPLANT
SPONGE LAP 4X18 RFD (DISPOSABLE) ×3 IMPLANT
STRIP CLOSURE SKIN 1/2X4 (GAUZE/BANDAGES/DRESSINGS) ×2 IMPLANT
SUT PLAIN 0 NONE (SUTURE) IMPLANT
SUT PLAIN 2 0 XLH (SUTURE) IMPLANT
SUT VIC AB 0 CT1 27 (SUTURE) ×4
SUT VIC AB 0 CT1 27XBRD ANBCTR (SUTURE) ×2 IMPLANT
SUT VIC AB 0 CTX 36 (SUTURE) ×10
SUT VIC AB 0 CTX36XBRD ANBCTRL (SUTURE) ×5 IMPLANT
SUT VIC AB 2-0 CT1 27 (SUTURE) ×2
SUT VIC AB 2-0 CT1 TAPERPNT 27 (SUTURE) ×1 IMPLANT
SUT VIC AB 4-0 KS 27 (SUTURE) ×3 IMPLANT
TOWEL OR 17X24 6PK STRL BLUE (TOWEL DISPOSABLE) ×3 IMPLANT
TRAY FOLEY W/BAG SLVR 14FR LF (SET/KITS/TRAYS/PACK) IMPLANT

## 2018-06-18 NOTE — Op Note (Signed)
PreOp Diagnosis: 1) Arrest of descent 2) Failed vacuum 3) Intrauterine pregnancy @[redacted]w[redacted]d  PostOp Diagnosis: same and OP presentation Procedure: Primary C-section Surgeon: Dr. Myna HidalgoJennifer Travarus Trudo Anesthesia: epidural Complications: none EBL: 1850cc UOP: 200cc Fluids: 2000cc  Findings: Female infant vertex presentation- delivered breech, normal uterus, tubes and ovaries bilaterally  PROCEDURE:  Informed consent was obtained from the patient with risks, benefits, complications, treatment options, and expected outcomes discussed with the patient.  The patient concurred with the proposed plan, giving informed consent with form signed.   The patient was taken to Operating Room, and identified with the procedure verified as C-Section Delivery with Time Out. With induction of anesthesia, the patient was prepped and draped in the usual sterile fashion. A Pfannenstiel incision was made and carried down through the subcutaneous tissue to the fascia. The fascia was incised in the midline and extended transversely. The superior aspect of the fascial incision was grasped with Kochers elevated and the underlying muscle dissected off. The inferior aspect of the facial incision was in similar fashion, grasped elevated and rectus muscles dissected off. The peritoneum was identified and entered. Peritoneal incision was extended longitudinally. The Alexis retractor was placed.The utero-vesical peritoneal reflection was identified and incised transversely with the [facility] scissors, the incision extended laterally, the bladder flap created digitally. A low transverse uterine incision was made.  Both arms were present at hysterotomy- baby in OP presentation.  Despite vaginal assistance, unable to delivery vaginally.  Each foot was identified and brought through to delivery, followed by the sacrum.  Arm and sacrum spontaneously delivered.  After the umbilical cord was clamped and cut cord blood was obtained for evaluation.   The  placenta was removed intact and appeared normal. The uterine outline, tubes and ovaries appeared normal. The uterine incision was closed with running locked sutures of 0 Vicryl.  Extension of the uterine incision was noted as though an additional low transverse hysterotomy below the incision.  This area was also closed in a running fashion.  A second layer of the same stitch was used in an imbricating fashion.  Excellent hemostasis was obtained with interrupted sutures as needed.  The pericolic gutters were then cleared of all clots and debris. The peritoneum was closed in a running fashion. The fascia was then reapproximated with running sutures of 0 Vicryl. The subcutaneous tissue was reapproximated with 2-0 plain gut suture.  The skin was closed with 4-0 vicryl in a subcuticular fashion.  Instrument, sponge, and needle counts were correct prior the abdominal closure and at the conclusion of the case. The patient was taken to recovery in stable condition.  Myna HidalgoJennifer Dalon Reichart, DO [phone] (cell) [phone] (office)

## 2018-06-18 NOTE — Anesthesia Postprocedure Evaluation (Signed)
Anesthesia Post Note  Patient: Connie Buchanan  Procedure(s) Performed: CESAREAN SECTION (N/A Abdomen)     Patient location during evaluation: Mother Baby Anesthesia Type: Epidural Level of consciousness: awake and alert Pain management: pain level controlled Vital Signs Assessment: post-procedure vital signs reviewed and stable Respiratory status: spontaneous breathing Cardiovascular status: stable Postop Assessment: no headache, patient able to bend at knees, no backache, no apparent nausea or vomiting, epidural receding and adequate PO intake Anesthetic complications: no    Last Vitals:  Vitals:   06/18/18 0614 06/18/18 0732  BP: (!) 95/53 (!) 94/52  Pulse: 86 89  Resp: 18 18  Temp: 36.6 C 36.9 C  SpO2: 98% 96%    Last Pain:  Vitals:   06/18/18 0732  TempSrc: Axillary  PainSc:    Pain Goal: Patients Stated Pain Goal: 3 (06/18/18 0345)               Salome ArntSterling, Jenipher Havel Marie

## 2018-06-18 NOTE — Transfer of Care (Signed)
Immediate Anesthesia Transfer of Care Note  Patient: Connie Buchanan  Procedure(s) Performed: CESAREAN SECTION (N/A )  Patient Location: PACU  Anesthesia Type:Epidural  Level of Consciousness: awake  Airway & Oxygen Therapy: Patient Spontanous Breathing  Post-op Assessment: Report given to RN and Post -op Vital signs reviewed and stable  Post vital signs: stable  Last Vitals:  Vitals Value Taken Time  BP    Temp    Pulse    Resp    SpO2      Last Pain:  Vitals:   06/18/18 0011  TempSrc: Oral  PainSc:          Complications: No apparent anesthesia complications

## 2018-06-18 NOTE — Progress Notes (Signed)
OB PN:   Pt has been completed +2 and pushing x 2hrs.  Kiwi applied with minimal to no descent.  Discussed options and advised primary C-section for arrest of descent.  Risk benefits and alternatives of cesarean section were discussed with the patient including but not limited to infection, bleeding, damage to bowel , bladder and baby with the need for further surgery. Pt voiced understanding and desires to proceed.   Myna HidalgoJennifer Ruvim Risko, DO [phone] (cell) [phone] (office)

## 2018-06-18 NOTE — Anesthesia Postprocedure Evaluation (Signed)
Anesthesia Post Note  Patient: Connie Buchanan  Procedure(s) Performed: CESAREAN SECTION (N/A )     Patient location during evaluation: PACU Anesthesia Type: Epidural Level of consciousness: awake and alert and oriented Pain management: pain level controlled Vital Signs Assessment: post-procedure vital signs reviewed and stable Respiratory status: spontaneous breathing, nonlabored ventilation and respiratory function stable Cardiovascular status: stable and blood pressure returned to baseline Postop Assessment: no headache, no backache, epidural receding, no apparent nausea or vomiting and patient able to bend at knees Anesthetic complications: no    Last Vitals:  Vitals:   06/18/18 0300 06/18/18 0315  BP: 112/68 103/71  Pulse: 83 95  Resp: 19 (!) 22  Temp:  36.8 C  SpO2: 96% 97%    Last Pain:  Vitals:   06/18/18 0300  TempSrc:   PainSc: Asleep   Pain Goal:                 [name] A.

## 2018-06-19 NOTE — Progress Notes (Signed)
Postpartum Note Day # 1  S:  Patient resting comfortable in bed.  Pain manageable- rates her pain 5/10  Tolerating general diet. + flatus, no BM.  Lochia moderate.  Ambulating without difficulty.  She denies n/v/f/c, SOB, or CP.  Pt plans on bottlefeeding  O: Temp:  [98.1 F (36.7 C)-98.7 F (37.1 C)] 98.2 F (36.8 C) (07/21 0630) Pulse Rate:  [76-97] 97 (07/21 0630) Resp:  [18-20] 18 (07/21 0630) BP: (94-109)/(53-68) 107/62 (07/21 0630) SpO2:  [95 %-100 %] 99 % (07/21 0630) Gen: A&Ox3, NAD CV: RRR, no MRG Resp: CTAB Abdomen: soft, NT, ND +BS Uterus: firm, non-tender, mild distension, below umbilicus Incision: c/d/i, bandage on Ext: No edema, no calf tenderness bilaterally  Labs:  Recent Labs    06/17/18 0950 06/18/18 0602  HGB 13.4 10.9*    A/P: Pt is a 27 y.o. G1P1001 s/p primary C-section, POD#1  - Pain controlled -GU: voiding freely -GI: Tolerating general diet -Activity: encouraged sitting up to chair and ambulation as tolerated -Prophylaxis: early ambulation -Labs: appropriate for recent surgery, on iron daily, pt asymptomatic  DISPO: Continue with routine postoperative care  Myna HidalgoJennifer Xia Stohr, DO [phone] (cell) [phone] (office)

## 2018-06-20 MED ORDER — IBUPROFEN 600 MG PO TABS: 600.0000 mg | ORAL_TABLET | Freq: Four times a day (QID) | ORAL | 1 refills | Status: DC | PRN

## 2018-06-20 NOTE — Progress Notes (Signed)
Subjective: Postpartum Day 2: Cesarean Delivery Patient reports incisional pain, tolerating PO and no problems voiding.    Objective: Vital signs in last 24 hours: Temp:  [97.8 F (36.6 C)-97.9 F (36.6 C)] 97.8 F (36.6 C) (07/22 0555) Pulse Rate:  [83-108] 83 (07/22 0555) Resp:  [18] 18 (07/22 0555) BP: (95-115)/(51-75) 115/75 (07/22 0555)  Physical Exam:  General: alert, cooperative and no distress Lochia: appropriate Uterine Fundus: firm Incision: healing well DVT Evaluation: No evidence of DVT seen on physical exam.  Recent Labs    06/17/18 0950 06/18/18 0602  HGB 13.4 10.9*  HCT 39.1 31.9*    Assessment/Plan: Status post Cesarean section. Doing well postoperatively.  Continue current care Encourage ambulation  Plan discharge home tomorrow .  Connie Buchanan J. 06/20/2018, 7:55 AM

## 2018-06-21 ENCOUNTER — Encounter (HOSPITAL_COMMUNITY): Payer: Self-pay | Admitting: *Deleted

## 2018-06-21 MED ORDER — OXYCODONE HCL 5 MG PO TABS: 5.0000 mg | ORAL_TABLET | ORAL | 0 refills | Status: DC | PRN

## 2018-06-21 NOTE — Discharge Summary (Signed)
OB Discharge Summary     Patient Name: Connie Buchanan DOB: 12/16/1990 MRN: [id number]  Date of admission: 06/17/2018 Delivering MD: Myna HidalgoZAN, JENNIFER   Date of discharge: 06/21/2018  Admitting diagnosis: WATER BROKE Intrauterine pregnancy: 7317w6d     Secondary diagnosis:  Active Problems:   Normal labor  Additional problems: None     Discharge diagnosis: Term Pregnancy Delivered                                                                                                Post partum procedures:None  Augmentation: Pitocin and Cytotec  Complications: None  Hospital course:  Onset of Labor With Unplanned C/S  27 y.o. yo G1P0 at 7217w6d was admitted in Latent Labor on 06/17/2018. Patient had a labor course significant for arrest of descent. Membrane Rupture Time/Date: 4:30 AM ,06/17/2018   The patient went for cesarean section due to Arrest of Descent, and delivered a Viable infant,06/18/2018  Details of operation can be found in separate operative note. Patient had an uncomplicated postpartum course.  She is ambulating,tolerating a regular diet, passing flatus, and urinating well.  Patient is discharged home in stable condition 06/21/18.  Physical exam  Vitals:   06/20/18 0555 06/20/18 1448 06/20/18 2130 06/21/18 0600  BP: 115/75 132/71 118/67 111/60  Pulse: 83 96 93 70  Resp: 18 18 16 16   Temp: 97.8 F (36.6 C) 97.6 F (36.4 C) 98.7 F (37.1 C) 98.3 F (36.8 C)  TempSrc: Oral Oral Oral Oral  SpO2:  98% 100% 99%  Weight:      Height:       General: alert, cooperative and no distress Lochia: appropriate Uterine Fundus: firm Incision: Healing well with no significant drainage DVT Evaluation: No evidence of DVT seen on physical exam. Labs: Lab Results  Component Value Date   WBC 25.8 (H) 06/18/2018   HGB 10.9 (L) 06/18/2018   HCT 31.9 (L) 06/18/2018   MCV 89.6 06/18/2018   PLT 249 06/18/2018   No flowsheet data found.  Discharge instruction: per After Visit Summary  and "Baby and Me Booklet".  After visit meds:  Allergies as of 06/21/2018   No Known Allergies     Medication List    STOP taking these medications   prenatal multivitamin Tabs tablet     TAKE these medications   ibuprofen 600 MG tablet Commonly known as:  ADVIL,MOTRIN Take 1 tablet (600 mg total) by mouth every 6 (six) hours as needed.   MULTIVITAMIN ADULT PO Take 1 tablet by mouth daily.   oxyCODONE 5 MG immediate release tablet Commonly known as:  Oxy IR/ROXICODONE Take 1 tablet (5 mg total) by mouth every 4 (four) hours as needed (pain scale 4-7).       Diet: routine diet  Activity: Advance as tolerated. Pelvic rest for 6 weeks.   Outpatient follow up:2 weeks Follow up Appt:No future appointments. Follow up Visit:No follow-ups on file.  Postpartum contraception: Not Discussed  Newborn Data: Live born female  Birth Weight: 8 lb 6.2 oz (3805 g) APGAR: 3, 5  Newborn Delivery   Birth date/time:  06/18/2018 01:34:00 Delivery type:  C-Section, Low Transverse Trial of labor:  No C-section categorization:  Primary     Baby Feeding: Bottle Disposition:home with mother   06/21/2018 Jessee Avers., MD

## 2018-06-22 ENCOUNTER — Inpatient Hospital Stay (HOSPITAL_COMMUNITY): Admission: RE | Admit: 2018-06-22 | Payer: No Typology Code available for payment source | Source: Ambulatory Visit

## 2018-11-30 HISTORY — PX: WISDOM TOOTH EXTRACTION: SHX21

## 2019-11-09 ENCOUNTER — Other Ambulatory Visit (HOSPITAL_COMMUNITY)
Admission: RE | Admit: 2019-11-09 | Discharge: 2019-11-09 | Disposition: A | Discharge status: Home / Self Care | Payer: Managed Care, Other (non HMO) | Source: Ambulatory Visit | Attending: Obstetrics and Gynecology | Admitting: Obstetrics and Gynecology

## 2019-11-09 ENCOUNTER — Other Ambulatory Visit: Payer: Self-pay | Admitting: Obstetrics and Gynecology

## 2019-11-09 DIAGNOSIS — Z01419 Encounter for gynecological examination (general) (routine) without abnormal findings: Secondary | ICD-10-CM | POA: Diagnosis not present

## 2019-11-14 ENCOUNTER — Other Ambulatory Visit (HOSPITAL_COMMUNITY)
Admission: RE | Admit: 2019-11-14 | Discharge: 2019-11-14 | Disposition: A | Discharge status: Home / Self Care | Payer: Managed Care, Other (non HMO) | Source: Ambulatory Visit | Attending: Obstetrics and Gynecology | Admitting: Obstetrics and Gynecology

## 2019-11-14 DIAGNOSIS — Z20828 Contact with and (suspected) exposure to other viral communicable diseases: Secondary | ICD-10-CM | POA: Insufficient documentation

## 2019-11-14 DIAGNOSIS — Z01812 Encounter for preprocedural laboratory examination: Secondary | ICD-10-CM | POA: Diagnosis not present

## 2019-11-14 LAB — CYTOLOGY - PAP: Diagnosis: NEGATIVE

## 2019-11-15 ENCOUNTER — Other Ambulatory Visit: Payer: Self-pay | Admitting: Obstetrics and Gynecology

## 2019-11-15 ENCOUNTER — Other Ambulatory Visit: Payer: Self-pay

## 2019-11-15 ENCOUNTER — Encounter (HOSPITAL_BASED_OUTPATIENT_CLINIC_OR_DEPARTMENT_OTHER): Payer: Self-pay | Admitting: Obstetrics and Gynecology

## 2019-11-15 LAB — NOVEL CORONAVIRUS, NAA (HOSP ORDER, SEND-OUT TO REF LAB; TAT 18-24 HRS): SARS-CoV-2, NAA: NOT DETECTED

## 2019-11-15 NOTE — Progress Notes (Signed)
Spoke w/ via phone for pre-op interview--- PT Lab needs dos----  Urine preg. (pre-op orders pending             Lab results------ no COVID test ------  11-14-2019 Arrive at -------  1100 NPO after ------ MN w/ exception clear liquids until 0700 then nothing by mouth (no cream/ milk products) Medications to take morning of surgery ----- Diabetic medication ----- n/a Patient Special Instructions ----- n/a Pre-Op special Istructions ----- requested for pre-op orders Patient verbalized understanding of instructions that were given at this phone interview. Patient denies shortness of breath, chest pain, fever, cough a this phone interview.

## 2019-11-17 ENCOUNTER — Ambulatory Visit (HOSPITAL_BASED_OUTPATIENT_CLINIC_OR_DEPARTMENT_OTHER): Payer: Managed Care, Other (non HMO) | Admitting: Anesthesiology

## 2019-11-17 ENCOUNTER — Ambulatory Visit (HOSPITAL_BASED_OUTPATIENT_CLINIC_OR_DEPARTMENT_OTHER)
Admission: RE | Admit: 2019-11-17 | Discharge: 2019-11-17 | Disposition: A | Discharge status: Home / Self Care | Payer: Managed Care, Other (non HMO) | Attending: Obstetrics and Gynecology | Admitting: Obstetrics and Gynecology

## 2019-11-17 ENCOUNTER — Encounter (HOSPITAL_BASED_OUTPATIENT_CLINIC_OR_DEPARTMENT_OTHER): Payer: Self-pay | Admitting: Obstetrics and Gynecology

## 2019-11-17 ENCOUNTER — Other Ambulatory Visit: Payer: Self-pay

## 2019-11-17 ENCOUNTER — Encounter (HOSPITAL_BASED_OUTPATIENT_CLINIC_OR_DEPARTMENT_OTHER)
Admission: RE | Disposition: A | Discharge status: Home / Self Care | Payer: Self-pay | Source: Home / Self Care | Attending: Obstetrics and Gynecology

## 2019-11-17 DIAGNOSIS — F17210 Nicotine dependence, cigarettes, uncomplicated: Secondary | ICD-10-CM | POA: Diagnosis not present

## 2019-11-17 DIAGNOSIS — N750 Cyst of Bartholin's gland: Secondary | ICD-10-CM | POA: Diagnosis present

## 2019-11-17 HISTORY — PX: BARTHOLIN CYST MARSUPIALIZATION: SHX5383

## 2019-11-17 HISTORY — DX: Cyst of Bartholin's gland: N75.0

## 2019-11-17 HISTORY — DX: Personal history of other diseases of the respiratory system: Z87.09

## 2019-11-17 LAB — CBC
HCT: 48.4 % — ABNORMAL HIGH (ref 36.0–46.0)
Hemoglobin: 16 g/dL — ABNORMAL HIGH (ref 12.0–15.0)
MCH: 30.4 pg (ref 26.0–34.0)
MCHC: 33.1 g/dL (ref 30.0–36.0)
MCV: 92 fL (ref 80.0–100.0)
Platelets: 337 10*3/uL (ref 150–400)
RBC: 5.26 MIL/uL — ABNORMAL HIGH (ref 3.87–5.11)
RDW: 12.2 % (ref 11.5–15.5)
WBC: 9.4 10*3/uL (ref 4.0–10.5)
nRBC: 0 % (ref 0.0–0.2)

## 2019-11-17 LAB — POCT PREGNANCY, URINE: Preg Test, Ur: NEGATIVE

## 2019-11-17 SURGERY — MARSUPIALIZATION, CYST, BARTHOLIN'S GLAND
Anesthesia: General | Site: Vagina

## 2019-11-17 MED ORDER — SODIUM CHLORIDE 0.9 % IR SOLN
Status: DC | PRN
  Administered 2019-11-17: 500 mL

## 2019-11-17 MED ORDER — KETOROLAC TROMETHAMINE 30 MG/ML IJ SOLN
INTRAMUSCULAR | Status: AC
  Filled 2019-11-17: qty 1

## 2019-11-17 MED ORDER — MIDAZOLAM HCL 2 MG/2ML IJ SOLN
INTRAMUSCULAR | Status: DC | PRN
  Administered 2019-11-17: 2 mg via INTRAVENOUS

## 2019-11-17 MED ORDER — PROPOFOL 10 MG/ML IV BOLUS
INTRAVENOUS | Status: DC | PRN
  Administered 2019-11-17: 200 mg via INTRAVENOUS

## 2019-11-17 MED ORDER — ACETAMINOPHEN 500 MG PO TABS
ORAL_TABLET | ORAL | Status: AC
  Filled 2019-11-17: qty 2

## 2019-11-17 MED ORDER — DEXAMETHASONE SODIUM PHOSPHATE 10 MG/ML IJ SOLN
INTRAMUSCULAR | Status: AC
  Filled 2019-11-17: qty 1

## 2019-11-17 MED ORDER — FENTANYL CITRATE (PF) 100 MCG/2ML IJ SOLN
INTRAMUSCULAR | Status: AC
  Filled 2019-11-17: qty 2

## 2019-11-17 MED ORDER — FENTANYL CITRATE (PF) 100 MCG/2ML IJ SOLN
INTRAMUSCULAR | Status: DC | PRN
  Administered 2019-11-17 (×2): 50 ug via INTRAVENOUS

## 2019-11-17 MED ORDER — ONDANSETRON HCL 4 MG/2ML IJ SOLN
INTRAMUSCULAR | Status: DC | PRN
  Administered 2019-11-17: 4 mg via INTRAVENOUS

## 2019-11-17 MED ORDER — IBUPROFEN 800 MG PO TABS: 800.0000 mg | ORAL_TABLET | Freq: Three times a day (TID) | ORAL | 0 refills | Status: DC | PRN

## 2019-11-17 MED ORDER — LIDOCAINE 2% (20 MG/ML) 5 ML SYRINGE
INTRAMUSCULAR | Status: AC
  Filled 2019-11-17: qty 5

## 2019-11-17 MED ORDER — OXYCODONE HCL 5 MG PO TABS: ORAL_TABLET | ORAL | 0 refills | Status: DC

## 2019-11-17 MED ORDER — PROPOFOL 10 MG/ML IV BOLUS
INTRAVENOUS | Status: AC
  Filled 2019-11-17: qty 20

## 2019-11-17 MED ORDER — BUPIVACAINE HCL (PF) 0.25 % IJ SOLN
INTRAMUSCULAR | Status: DC | PRN
  Administered 2019-11-17: 10 mL

## 2019-11-17 MED ORDER — ACETAMINOPHEN 500 MG PO TABS
1000.0000 mg | ORAL_TABLET | ORAL | Status: AC
  Administered 2019-11-17: 1000 mg via ORAL
  Filled 2019-11-17: qty 2

## 2019-11-17 MED ORDER — ONDANSETRON HCL 4 MG/2ML IJ SOLN
INTRAMUSCULAR | Status: AC
  Filled 2019-11-17: qty 2

## 2019-11-17 MED ORDER — DEXAMETHASONE SODIUM PHOSPHATE 10 MG/ML IJ SOLN
INTRAMUSCULAR | Status: DC | PRN
  Administered 2019-11-17: 10 mg via INTRAVENOUS

## 2019-11-17 MED ORDER — LACTATED RINGERS IV SOLN
INTRAVENOUS | Status: DC
  Filled 2019-11-17: qty 1000

## 2019-11-17 MED ORDER — LIDOCAINE 2% (20 MG/ML) 5 ML SYRINGE
INTRAMUSCULAR | Status: DC | PRN
  Administered 2019-11-17: 60 mg via INTRAVENOUS

## 2019-11-17 MED ORDER — KETOROLAC TROMETHAMINE 30 MG/ML IJ SOLN
INTRAMUSCULAR | Status: DC | PRN
  Administered 2019-11-17: 30 mg via INTRAVENOUS

## 2019-11-17 SURGICAL SUPPLY — 27 items
BLADE SURG 15 STRL LF DISP TIS (BLADE) ×1 IMPLANT
BLADE SURG 15 STRL SS (BLADE) ×2
CATH ROBINSON RED A/P 16FR (CATHETERS) ×3 IMPLANT
ELECT REM PT RETURN 9FT ADLT (ELECTROSURGICAL) ×3
ELECTRODE REM PT RTRN 9FT ADLT (ELECTROSURGICAL) IMPLANT
GLOVE BIOGEL M 6.5 STRL (GLOVE) ×6 IMPLANT
GLOVE BIOGEL PI IND STRL 6.5 (GLOVE) ×1 IMPLANT
GLOVE BIOGEL PI IND STRL 7.0 (GLOVE) ×1 IMPLANT
GLOVE BIOGEL PI INDICATOR 6.5 (GLOVE) ×4
GLOVE BIOGEL PI INDICATOR 7.0 (GLOVE) ×2
GOWN STRL REUS W/ TWL LRG LVL3 (GOWN DISPOSABLE) ×2 IMPLANT
GOWN STRL REUS W/TWL LRG LVL3 (GOWN DISPOSABLE) ×4
NEEDLE HYPO 22GX1.5 SAFETY (NEEDLE) ×2 IMPLANT
NS IRRIG 500ML POUR BTL (IV SOLUTION) ×2 IMPLANT
PACK VAGINAL MINOR WOMEN LF (CUSTOM PROCEDURE TRAY) ×3 IMPLANT
PAD OB MATERNITY 4.3X12.25 (PERSONAL CARE ITEMS) ×3 IMPLANT
PENCIL BUTTON HOLSTER BLD 10FT (ELECTRODE) ×3 IMPLANT
SUT MON AB 3-0 SH 27 (SUTURE)
SUT MON AB 3-0 SH27 (SUTURE) IMPLANT
SUT VIC AB 3-0 CT1 27 (SUTURE)
SUT VIC AB 3-0 CT1 TAPERPNT 27 (SUTURE) IMPLANT
SUT VIC AB 3-0 SH 27 (SUTURE) ×2
SUT VIC AB 3-0 SH 27X BRD (SUTURE) IMPLANT
TOWEL GREEN STERILE FF (TOWEL DISPOSABLE) ×6 IMPLANT
TUBE CONNECTING 12'X1/4 (SUCTIONS) ×1
TUBE CONNECTING 12X1/4 (SUCTIONS) ×2 IMPLANT
YANKAUER SUCT BULB TIP NO VENT (SUCTIONS) ×3 IMPLANT

## 2019-11-17 NOTE — Anesthesia Procedure Notes (Signed)
Procedure Name: LMA Insertion Date/Time: 11/17/2019 1:02 PM Performed by: Wanita Chamberlain, CRNA Pre-anesthesia Checklist: Patient identified, Timeout performed, Emergency Drugs available, Suction available and Patient being monitored Patient Re-evaluated:Patient Re-evaluated prior to induction Oxygen Delivery Method: Circle system utilized Preoxygenation: Pre-oxygenation with 100% oxygen Induction Type: IV induction Ventilation: Mask ventilation without difficulty LMA: LMA inserted LMA Size: 4.0 Number of attempts: 1 Placement Confirmation: breath sounds checked- equal and bilateral,  CO2 detector and positive ETCO2 Tube secured with: Tape Dental Injury: Teeth and Oropharynx as per pre-operative assessment

## 2019-11-17 NOTE — Anesthesia Postprocedure Evaluation (Signed)
Anesthesia Post Note  Patient: [name]  Procedure(s) Performed: BARTHOLIN CYST MARSUPIALIZATION (N/A Vagina )     Patient location during evaluation: PACU Anesthesia Type: General Level of consciousness: awake and alert Pain management: pain level controlled Vital Signs Assessment: post-procedure vital signs reviewed and stable Respiratory status: spontaneous breathing, nonlabored ventilation, respiratory function stable and patient connected to nasal cannula oxygen Cardiovascular status: blood pressure returned to baseline and stable Postop Assessment: no apparent nausea or vomiting Anesthetic complications: no    Last Vitals:  Vitals:   11/17/19 1415 11/17/19 1520  BP: 109/62 110/62  Pulse: 89 75  Resp: 15 16  Temp:  36.9 C  SpO2: 98% 96%    Last Pain:  Vitals:   11/17/19 1515  TempSrc:   PainSc: 0-No pain                 Tiajuana Amass

## 2019-11-17 NOTE — Discharge Instructions (Signed)
May use ice to affected area for the first 24 hours after surgery.   Post Anesthesia Home Care Instructions  Activity: Get plenty of rest for the remainder of the day. A responsible individual must stay with you for 24 hours following the procedure.  For the next 24 hours, DO NOT: -Drive a car -Paediatric nurse -Drink alcoholic beverages -Take any medication unless instructed by your physician -Make any legal decisions or sign important papers.  Meals: Start with liquid foods such as gelatin or soup. Progress to regular foods as tolerated. Avoid greasy, spicy, heavy foods. If nausea and/or vomiting occur, drink only clear liquids until the nausea and/or vomiting subsides. Call your physician if vomiting continues.  Special Instructions/Symptoms: Your throat may feel dry or sore from the anesthesia or the breathing tube placed in your throat during surgery. If this causes discomfort, gargle with warm salt water. The discomfort should disappear within 24 hours.

## 2019-11-17 NOTE — H&P (Signed)
Date of Initial H&P: 11/17/2019  History reviewed, patient examined, no change in status, stable for surgery.  

## 2019-11-17 NOTE — H&P (Signed)
Reason for Appointment  1. Consult for marsupialization of cyst      History of Present Illness  Isolation Precautions:          Respiratory Illness Screening  1. Is fever present / reported?  No, 2. Are respiratory illness symptom(s) present / reported?  No, 3. Are other symptom(s) present / reported?  No, 5. Has there been reported travel to a High Risk respiratory illness region?  No, 6. Has close* contact with person(s) known to have communicable illness been reported?  No, 7. Did travel or close contact (if applicable) occur within 14 days of symptom onset?  No.  General:          28 y/o presents for consultation for marsupialization of bartholins cyst        She has had this excised and drained x2 in the past.         She wishes to have this excised and drained.        She is scheduled for marsupializtion of bartholins cyst on 11/17/2019.        Upon pelvic examination cyst is easily visualized.    Reason for Appointment  1. Consult for marsupialization of cyst      History of Present Illness  Isolation Precautions:          Respiratory Illness Screening  1. Is fever present / reported?  No, 2. Are respiratory illness symptom(s) present / reported?  No, 3. Are other symptom(s) present / reported?  No, 5. Has there been reported travel to a High Risk respiratory illness region?  No, 6. Has close* contact with person(s) known to have communicable illness been reported?  No, 7. Did travel or close contact (if applicable) occur within 14 days of symptom onset?  No.  General:          28 y/o presents for consultation for marsupialization of bartholins cyst        She has had this excised and drained x2 in the past.         She wishes to have this excised and drained.        She is scheduled for marsupializtion of bartholins cyst on 11/17/2019.        Upon pelvic examination cyst is easily visualized.  Current Medications  TakingNexplanon 68 mg Implant as directed    Tylenol 1 tab  Oral as needed, Notes:  prn    Ibuprofen 800 MG Tablet 1 tablet with food or milk as needed Orally Three times a day    Not-TakingAmoxicillin , Notes: took 1  today    Medication List reviewed and reconciled with the patient          Past Medical History       Medical History Verified..       Surgical History  Tonsilectomy   C-section 06/18/2018      Family History  Father: alive, Alcoholism, diagnosed with Hypertension  Mother: alive, Diabetes  Brother 1: deceased, Alcoholism  Paternal Grand Mother: Breast cancer  Maternal Grand Mother: Breast cancer  1 brother(s) . 1daughter(s) .       Social History  General:   Tobacco use  cigarettes:  Current smoker, Frequency:  1 PPD, Estimated Pack-years:  12, Tobacco history last updated  10/25/2019, Vaping  No. no Alcohol. no Recreational drug use. Marital Status: married. Children: 1, daughter. OCCUPATION: employed, [employer].     Gyn History  Sexual activity currently sexually active.   Periods :  irregular.   LMP 09/11/2019.   Birth control Nexplanon - placed 07/21/2018.   Last pap smear date 11/18/17 - WNL.   Last mammogram date N/A.      OB History  Number of pregnancies  1.   Pregnancy # 1  live birth, C-section delivery, girl.      Allergies  N.K.D.A.      Hospitalization/Major Diagnostic Procedure  childbirth 05/2018      Review of Systems  CONSTITUTIONAL:          Chills No.  Fatigue No.  Fever No.  Night sweats No.  Recent travel outside [place] No.  Sweats No.  Weight change No.      OPHTHALMOLOGY:          Blurring of vision no.  Change in vision no.  Double vision no.      ENT:          Dizziness no.  Nose bleeds no.  Sore throat no.  Teeth pain no.      ALLERGY:          Hives no.      CARDIOLOGY:          Chest pain no.  High blood pressure no.  Irregular heart beat no.  Leg edema no.  Palpitations no.      RESPIRATORY:          Shortness of breath no.  Cough no.  Wheezing no.      UROLOGY:           Pain with urination no.  Urinary urgency no.  Urinary frequency no.  Urinary incontinence no.  Difficulty urinating No.  Blood in urine No.      GASTROENTEROLOGY:          Abdominal pain no.  Appetite change no.  Bloating/belching no.  Blood in stool or on toilet paper no.  Change in bowel movements no.  Constipation no.  Diarrhea no.  Difficulty swallowing no.  Nausea no.      FEMALE REPRODUCTIVE:          Vulvar pain no.  Vulvar rash no.  Abnormal vaginal bleeding no.  Breast pain no.  Nipple discharge no.  Pain with intercourse no.  Pelvic pain no.  Unusual vaginal discharge no.  Vaginal itching no.      MUSCULOSKELETAL:          Muscle aches no.      NEUROLOGY:          Headache no.  Tingling/numbness no.  Weakness no.      PSYCHOLOGY:          Depression no.  Anxiety no.  Nervousness no.  Sleep disturbances no.  Suicidal ideation no .      ENDOCRINOLOGY:          Excessive thirst no.  Excessive urination no.  Hair loss no.  Heat or cold intolerance no.      HEMATOLOGY/LYMPH:          Abnormal bleeding no.  Easy bruising no.  Swollen glands no.      DERMATOLOGY:          New/changing skin lesion no.  Rash no.  Sores no.           Reason for Appointment  1. Consult for marsupialization of cyst      History of Present Illness  Isolation Precautions:          Respiratory Illness Screening  1. Is  fever present / reported?  No, 2. Are respiratory illness symptom(s) present / reported?  No, 3. Are other symptom(s) present / reported?  No, 5. Has there been reported travel to a High Risk respiratory illness region?  No, 6. Has close* contact with person(s) known to have communicable illness been reported?  No, 7. Did travel or close contact (if applicable) occur within 14 days of symptom onset?  No.  General:          28 y/o presents for consultation for marsupialization of bartholins cyst        She has had this excised and drained x2 in the past.         She wishes to have this excised  and drained.        She is scheduled for marsupializtion of bartholins cyst on 11/17/2019.        Upon pelvic examination cyst is easily visualized.    Vital Signs  Wt 181, Wt change .2 lb, Ht 66, BMI 29.21, Temp 97.5, Pulse sitting 104, BP sitting 122/74.    Examination  General Examination:        CONSTITUTIONAL: alert, oriented, NAD . SKIN: moist, warm. EYES: Conjunctiva clear. LUNGS: good I:E efffort noted. ABDOMEN: soft, non-tender/non-distended, bowel sounds present . FEMALE GENITOURINARY: 3 cm left batholins gland cyst noted vagina - pink moist mucosa, no lesions or abnormal discharge, cervix - no discharge or lesions or CMT, adnexa - no masses or tenderness, uterus - nontender and normal size on palpation . PSYCH: affect normal, good eye contact.      Physical Examination      Pt aware of scribe services today.    Assessments    1. 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 Bartholin's gland cyst   Notes: Pt. is scheduled for marsupialization of Bartholin's gland cyst on 11/17/2019 as she has had recurrent cysts. Discussed there is risk of infection and bleeding. Pt advised to avoid aleve, aspirin, ibuprofen 10 days prior to surgery. She is encouraged to cut back or stop smoke as it hinders the healing process. No eating or drinking night prior to surgery. Pt. advised once she has covid testing she will need to go into quarantine until she has her surgery. Pt. advised she can return to work 11/20/2019. Pt. advised no sexual intercourse until 2 week post op visit to avoid infection.         Procedures  Scribe Documentation:          Attestation:  I personally scribed for Dr. Richardson Dopp on the date of this appointment. Electronically signed by Evlyn Courier, Edson Snowball 10/25/2019 11:19:30 AM > .            Visit Codes  37902 OV LEVEL 3.       Follow Up  11/17/2019 (Reason: Marsupalization outpatient at the hospital )    Care Plan Details             Electronically signed by Gerald Leitz , MD on 11/15/2019 at 11:29 AM EST  Sign off status: Completed     --------------------------------------------------------------------------------  [facility] [address] [place], [place] [id number] Tel: [phone] Fax: [phone]      Vital Signs  Wt 181, Wt change .2 lb, Ht 66, BMI 29.21, Temp 97.5, Pulse sitting 104, BP sitting 122/74.    Examination  General Examination:        CONSTITUTIONAL: alert, oriented, NAD . SKIN: moist, warm.  EYES: Conjunctiva clear. LUNGS: good I:E efffort noted. ABDOMEN: soft, non-tender/non-distended, bowel sounds present . FEMALE GENITOURINARY: 3 cm left batholins gland cyst noted vagina - pink moist mucosa, no lesions or abnormal discharge, cervix - no discharge or lesions or CMT, adnexa - no masses or tenderness, uterus - nontender and normal size on palpation . PSYCH: affect normal, good eye contact.      Physical Examination      Pt aware of scribe services today.    Assessments    1. 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 Bartholin's gland cyst   Notes: Pt. is scheduled for marsupialization of Bartholin's gland cyst on 11/17/2019 as she has had recurrent cysts. Discussed there is risk of infection and bleeding. Pt advised to avoid aleve, aspirin, ibuprofen 10 days prior to surgery. She is encouraged to cut back or stop smoke as it hinders the healing process. No eating or drinking night prior to surgery. Pt. advised once she has covid testing she will need to go into quarantine until she has her surgery. Pt. advised she can return to work 11/20/2019. Pt. advised no sexual intercourse until 2 week post op visit to avoid infection.          Vital Signs  Wt 181, Wt change .2 lb, Ht 66, BMI 29.21, Temp 97.5, Pulse sitting 104, BP sitting 122/74.    Examination  General Examination:        CONSTITUTIONAL: alert, oriented, NAD . SKIN: moist, warm. EYES: Conjunctiva  clear. LUNGS: good I:E efffort noted. ABDOMEN: soft, non-tender/non-distended, bowel sounds present . FEMALE GENITOURINARY: 3 cm left batholins gland cyst noted vagina - pink moist mucosa, no lesions or abnormal discharge, cervix - no discharge or lesions or CMT, adnexa - no masses or tenderness, uterus - nontender and normal size on palpation . PSYCH: affect normal, good eye contact.      Physical Examination      Pt aware of scribe services today.    Assessments    1. 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 Bartholin's gland cyst   Notes: Pt. is scheduled for marsupialization of Bartholin's gland cyst on 11/17/2019 as she has had recurrent cysts. Discussed there is risk of infection and bleeding. Pt advised to avoid aleve, aspirin, ibuprofen 10 days prior to surgery. She is encouraged to cut back or stop smoke as it hinders the healing process. No eating or drinking night prior to surgery. Pt. advised once she has covid testing she will need to go into quarantine until she has her surgery. Pt. advised she can return to work 11/20/2019. Pt. advised no sexual intercourse until 2 week post op visit to avoid infection.

## 2019-11-17 NOTE — Anesthesia Preprocedure Evaluation (Addendum)
Anesthesia Evaluation  Patient identified by MRN, date of birth, ID band Patient awake    Reviewed: Allergy & Precautions, NPO status , Patient's Chart, lab work & pertinent test results  Airway Mallampati: II  TM Distance: >3 FB Neck ROM: Full    Dental  (+) Dental Advisory Given, Teeth Intact   Pulmonary Current Smoker and Patient abstained from smoking.,    breath sounds clear to auscultation       Cardiovascular negative cardio ROS   Rhythm:Regular Rate:Normal     Neuro/Psych negative neurological ROS     GI/Hepatic negative GI ROS, Neg liver ROS,   Endo/Other  negative endocrine ROS  Renal/GU negative Renal ROS     Musculoskeletal   Abdominal   Peds  Hematology negative hematology ROS (+)   Anesthesia Other Findings   Reproductive/Obstetrics                           Anesthesia Physical Anesthesia Plan  ASA: II  Anesthesia Plan: General   Post-op Pain Management:    Induction: Intravenous  PONV Risk Score and Plan: 2 and Dexamethasone, Ondansetron and Treatment may vary due to age or medical condition  Airway Management Planned: LMA  Additional Equipment:   Intra-op Plan:   Post-operative Plan: Extubation in OR  Informed Consent: I have reviewed the patients History and Physical, chart, labs and discussed the procedure including the risks, benefits and alternatives for the proposed anesthesia with the patient or authorized representative who has indicated his/her understanding and acceptance.     Dental advisory given  Plan Discussed with: CRNA  Anesthesia Plan Comments:         Anesthesia Quick Evaluation

## 2019-11-17 NOTE — Op Note (Signed)
11/17/2019  1:36 PM  PATIENT:  Connie Buchanan  28 y.o. female  PRE-OPERATIVE DIAGNOSIS:  N75.0 Bartholin gland cyst  POST-OPERATIVE DIAGNOSIS:  N75.0 Bartholin gland cyst  PROCEDURE:  Procedure(s): BARTHOLIN CYST MARSUPIALIZATION (N/A)  SURGEON:  Surgeon(s) and Role:    Christophe Louis, MD - Primary  PHYSICIAN ASSISTANT: None  ASSISTANTS: none   ANESTHESIA:   general  EBL:  10 mL   BLOOD ADMINISTERED:none  DRAINS: none   LOCAL MEDICATIONS USED:  MARCAINE     SPECIMEN:  No Specimen  DISPOSITION OF SPECIMEN:  N/A  COUNTS:  YES  TOURNIQUET:  * No tourniquets in log *  DICTATION: .Dragon Dictation  PLAN OF CARE: Discharge to home after PACU  PATIENT DISPOSITION:  PACU - hemodynamically stable.   Delay start of Pharmacological VTE agent (>24hrs) due to surgical blood loss or risk of bleeding: not applicable  Findings: Left Bartholins gland cyst approximately 4 cm in size.   Procedure: Procedure. Pt was taken to the operating room where she was placed under general anesthesia. Time out was performed. She was prepped and draped in a sterile fashion. The mucosa covering the bartholins gland cyst meadially was injected with 10 cc of 0.5% marcaine. A 2 cm incision was made into the medial aspect of the bartholins gland cyst. Marsupaliazation was performed using 3-0 vicryl. Excellent hemostasis was noted.  Sponge lap and needle counts were correct x 2. Pt was awakened from anesthesia and taken to the recovery room in stable condition.

## 2019-11-17 NOTE — Transfer of Care (Signed)
Immediate Anesthesia Transfer of Care Note  Patient: Connie Buchanan  Procedure(s) Performed: BARTHOLIN CYST MARSUPIALIZATION (N/A Vagina )  Patient Location: PACU  Anesthesia Type:General  Level of Consciousness: awake, alert , oriented and patient cooperative  Airway & Oxygen Therapy: Patient Spontanous Breathing and Patient connected to nasal cannula oxygen  Post-op Assessment: Report given to RN and Post -op Vital signs reviewed and stable  Post vital signs: Reviewed and stable  Last Vitals:  Vitals Value Taken Time  BP    Temp    Pulse 93 11/17/19 1345  Resp    SpO2 95 % 11/17/19 1345  Vitals shown include unvalidated device data.  Last Pain:  Vitals:   11/17/19 1209  TempSrc: Oral  PainSc: 0-No pain      Patients Stated Pain Goal: 4 (16/07/37 1062)  Complications: No apparent anesthesia complications

## 2020-04-08 LAB — OB RESULTS CONSOLE HIV ANTIBODY (ROUTINE TESTING): HIV: NONREACTIVE

## 2020-04-08 LAB — OB RESULTS CONSOLE ABO/RH: RH Type: POSITIVE

## 2020-04-08 LAB — OB RESULTS CONSOLE RUBELLA ANTIBODY, IGM: Rubella: NON-IMMUNE/NOT IMMUNE

## 2020-04-08 LAB — OB RESULTS CONSOLE HEPATITIS B SURFACE ANTIGEN: Hepatitis B Surface Ag: NEGATIVE

## 2020-04-18 LAB — OB RESULTS CONSOLE GC/CHLAMYDIA
Chlamydia: NEGATIVE
Gonorrhea: NEGATIVE

## 2020-08-21 LAB — OB RESULTS CONSOLE RPR: RPR: NONREACTIVE

## 2020-10-28 ENCOUNTER — Encounter (HOSPITAL_COMMUNITY): Payer: Self-pay | Admitting: *Deleted

## 2020-10-28 NOTE — Patient Instructions (Signed)
Deziree Mokry  10/28/2020   Your procedure is scheduled on:  11/07/2020  Arrive at 0530 at [employer] on [address] at [facility]  and [employer]. You are invited to use the FREE valet parking or use the Visitor's parking deck.  Pick up the phone at the desk and dial [phone].  Call this number if you have problems the morning of surgery: [phone]  Remember:   Do not eat food:(After Midnight) Desps de medianoche.  Do not drink clear liquids: (After Midnight) Desps de medianoche.  Take these medicines the morning of surgery with A SIP OF WATER:  none   Do not wear jewelry, make-up or nail polish.  Do not wear lotions, powders, or perfumes. Do not wear deodorant.  Do not shave 48 hours prior to surgery.  Do not bring valuables to the hospital.  [facility] is not   responsible for any belongings or valuables brought to the hospital.  Contacts, dentures or bridgework may not be worn into surgery.  Leave suitcase in the car. After surgery it may be brought to your room.  For patients admitted to the hospital, checkout time is 11:00 AM the day of              discharge.      Please read over the following fact sheets that you were given:     Preparing for Surgery

## 2020-11-05 ENCOUNTER — Encounter (HOSPITAL_COMMUNITY)
Admission: RE | Admit: 2020-11-05 | Discharge: 2020-11-05 | Disposition: A | Discharge status: Home / Self Care | Payer: Managed Care, Other (non HMO) | Source: Ambulatory Visit | Attending: Obstetrics and Gynecology | Admitting: Obstetrics and Gynecology

## 2020-11-05 ENCOUNTER — Other Ambulatory Visit: Payer: Self-pay

## 2020-11-05 ENCOUNTER — Other Ambulatory Visit (HOSPITAL_COMMUNITY)
Admission: RE | Admit: 2020-11-05 | Discharge: 2020-11-05 | Disposition: A | Discharge status: Home / Self Care | Payer: Managed Care, Other (non HMO) | Source: Ambulatory Visit | Attending: Obstetrics and Gynecology | Admitting: Obstetrics and Gynecology

## 2020-11-05 DIAGNOSIS — Z20822 Contact with and (suspected) exposure to covid-19: Secondary | ICD-10-CM | POA: Insufficient documentation

## 2020-11-05 DIAGNOSIS — Z01812 Encounter for preprocedural laboratory examination: Secondary | ICD-10-CM | POA: Insufficient documentation

## 2020-11-05 LAB — TYPE AND SCREEN
ABO/RH(D): A POS
Antibody Screen: NEGATIVE

## 2020-11-05 LAB — CBC
HCT: 39.4 % (ref 36.0–46.0)
Hemoglobin: 12.3 g/dL (ref 12.0–15.0)
MCH: 26.3 pg (ref 26.0–34.0)
MCHC: 31.2 g/dL (ref 30.0–36.0)
MCV: 84.2 fL (ref 80.0–100.0)
Platelets: 350 10*3/uL (ref 150–400)
RBC: 4.68 MIL/uL (ref 3.87–5.11)
RDW: 14.3 % (ref 11.5–15.5)
WBC: 10.1 10*3/uL (ref 4.0–10.5)
nRBC: 0 % (ref 0.0–0.2)

## 2020-11-05 LAB — SARS CORONAVIRUS 2 (TAT 6-24 HRS): SARS Coronavirus 2: NEGATIVE

## 2020-11-05 LAB — RPR: RPR Ser Ql: NONREACTIVE

## 2020-11-06 ENCOUNTER — Other Ambulatory Visit: Payer: Self-pay | Admitting: Obstetrics and Gynecology

## 2020-11-06 NOTE — H&P (Signed)
Connie Buchanan is a 29 y.o. female G2P1001 presenting for repeat cesarean section and bilateral tubal ligation. Pregnancy has been uncomplicated. Prenatal care provided by Dr. Gerald Leitz with [facility].  . OB History    Gravida  2   Para  1   Term      Preterm      AB      Living        SAB      TAB      Ectopic      Multiple      Live Births             Past Medical History:  Diagnosis Date  . Bartholin gland cyst   . History of asthma    Past Surgical History:  Procedure Laterality Date  . BARTHOLIN CYST MARSUPIALIZATION N/A 11/17/2019   Procedure: BARTHOLIN CYST MARSUPIALIZATION;  Surgeon: Gerald Leitz, MD;  Location: [facility];  Service: Gynecology;  Laterality: N/A;  . CESAREAN SECTION N/A 06/18/2018   Procedure: CESAREAN SECTION;  Surgeon: Myna Hidalgo, DO;  Location: [facility];  Service: Obstetrics;  Laterality: N/A;  . TONSILLECTOMY  age 79  . WISDOM TOOTH EXTRACTION  2020   Family History: family history includes Alcohol abuse in her brother and father; Asthma in her mother; Breast cancer in her maternal grandmother; Depression in her mother; Diabetes in her father and mother; Hypertension in her father and mother; Lung cancer in her maternal grandmother. Social History:  reports that she has been smoking cigarettes. She has a 6.50 pack-year smoking history. She has never used smokeless tobacco. She reports that she does not drink alcohol and does not use drugs.     Maternal Diabetes: No Genetic Screening: Normal Maternal Ultrasounds/Referrals: Normal Fetal Ultrasounds or other Referrals:  None Maternal Substance Abuse:  No Significant Maternal Medications:  None Significant Maternal Lab Results:  Group B Strep negative Other Comments:  None  Review of Systems  Constitutional: Negative.   HENT: Negative.   Eyes: Negative.   Respiratory: Negative.   Cardiovascular: Negative.   Gastrointestinal: Negative.    Endocrine: Negative.   Genitourinary: Negative.   Musculoskeletal: Negative.   Skin: Negative.   Allergic/Immunologic: Negative.   Neurological: Negative.   Hematological: Negative.   Psychiatric/Behavioral: Negative.    History   There were no vitals taken for this visit. Maternal Exam:  Introitus: Normal vulva.   Physical Exam Vitals reviewed.  Constitutional:      Appearance: Normal appearance.  HENT:     Head: Normocephalic and atraumatic.  Cardiovascular:     Rate and Rhythm: Normal rate and regular rhythm.     Pulses: Normal pulses.     Heart sounds: Normal heart sounds.  Pulmonary:     Effort: Pulmonary effort is normal.     Breath sounds: Normal breath sounds.  Abdominal:     Tenderness: There is no abdominal tenderness.  Genitourinary:    General: Normal vulva.  Musculoskeletal:        General: Swelling present. Normal range of motion.     Cervical back: Normal range of motion and neck supple.  Skin:    General: Skin is warm and dry.  Neurological:     General: No focal deficit present.     Mental Status: She is alert and oriented to person, place, and time.  Psychiatric:        Mood and Affect: Mood normal.        Behavior: Behavior  normal.     Prenatal labs: ABO, Rh: --/--/A POS (12/07 0859) Antibody: NEG (12/07 0859) Rubella: Nonimmune (05/10 0000) RPR: NON REACTIVE (12/07 0851)  HBsAg: Negative (05/10 0000)  HIV: Non-reactive (05/10 0000)  GBS:   negative   Assessment/Plan: 39 wks and 0 days with h/o cesarean section . Pt desires repeat cesarean section and bilateral tubal ligation.  . R/B/A of cesarean section discussed with the patient including but not limited to infection, bleeding damage to bowel bladder and baby with the need for further surgery. R/O transfusion HIV/ Hep B&C discussed. Pt voiced understanding and desires to proceed with cesarean section.  R/O tubal ligation discussed including but not limited to 1 % r/o failure with 50%  r/o of ectopic pregnancy if failure occurs which can be life threatening   Gerald Leitz 11/06/2020, 8:09 PM

## 2020-11-07 ENCOUNTER — Inpatient Hospital Stay (HOSPITAL_COMMUNITY)
Admission: AD | Admit: 2020-11-07 | Discharge: 2020-11-09 | DRG: 786 | Disposition: A | Discharge status: Home / Self Care | Payer: Managed Care, Other (non HMO) | Attending: Obstetrics and Gynecology | Admitting: Obstetrics and Gynecology

## 2020-11-07 ENCOUNTER — Encounter (HOSPITAL_COMMUNITY)
Admission: AD | Disposition: A | Discharge status: Home / Self Care | Payer: Self-pay | Source: Home / Self Care | Attending: Obstetrics and Gynecology

## 2020-11-07 ENCOUNTER — Inpatient Hospital Stay (HOSPITAL_COMMUNITY): Payer: Managed Care, Other (non HMO) | Admitting: Anesthesiology

## 2020-11-07 ENCOUNTER — Inpatient Hospital Stay (HOSPITAL_BASED_OUTPATIENT_CLINIC_OR_DEPARTMENT_OTHER): Payer: Managed Care, Other (non HMO)

## 2020-11-07 ENCOUNTER — Inpatient Hospital Stay (HOSPITAL_COMMUNITY)
Admission: RE | Admit: 2020-11-07 | Payer: Managed Care, Other (non HMO) | Source: Home / Self Care | Admitting: Obstetrics and Gynecology

## 2020-11-07 ENCOUNTER — Inpatient Hospital Stay (HOSPITAL_COMMUNITY)
Admission: AD | Admit: 2020-11-07 | Payer: Managed Care, Other (non HMO) | Source: Home / Self Care | Admitting: Obstetrics and Gynecology

## 2020-11-07 ENCOUNTER — Encounter (HOSPITAL_COMMUNITY): Payer: Self-pay | Admitting: Obstetrics and Gynecology

## 2020-11-07 DIAGNOSIS — O99334 Smoking (tobacco) complicating childbirth: Secondary | ICD-10-CM | POA: Diagnosis present

## 2020-11-07 DIAGNOSIS — Z23 Encounter for immunization: Secondary | ICD-10-CM

## 2020-11-07 DIAGNOSIS — O42913 Preterm premature rupture of membranes, unspecified as to length of time between rupture and onset of labor, third trimester: Secondary | ICD-10-CM | POA: Diagnosis not present

## 2020-11-07 DIAGNOSIS — Z3A39 39 weeks gestation of pregnancy: Secondary | ICD-10-CM

## 2020-11-07 DIAGNOSIS — R109 Unspecified abdominal pain: Secondary | ICD-10-CM | POA: Diagnosis not present

## 2020-11-07 DIAGNOSIS — F17210 Nicotine dependence, cigarettes, uncomplicated: Secondary | ICD-10-CM | POA: Diagnosis present

## 2020-11-07 DIAGNOSIS — O4292 Full-term premature rupture of membranes, unspecified as to length of time between rupture and onset of labor: Principal | ICD-10-CM | POA: Diagnosis present

## 2020-11-07 DIAGNOSIS — O26899 Other specified pregnancy related conditions, unspecified trimester: Secondary | ICD-10-CM

## 2020-11-07 DIAGNOSIS — Z20822 Contact with and (suspected) exposure to covid-19: Secondary | ICD-10-CM | POA: Diagnosis present

## 2020-11-07 DIAGNOSIS — O34211 Maternal care for low transverse scar from previous cesarean delivery: Secondary | ICD-10-CM | POA: Diagnosis present

## 2020-11-07 DIAGNOSIS — O4593 Premature separation of placenta, unspecified, third trimester: Secondary | ICD-10-CM | POA: Diagnosis present

## 2020-11-07 DIAGNOSIS — O34219 Maternal care for unspecified type scar from previous cesarean delivery: Secondary | ICD-10-CM | POA: Diagnosis present

## 2020-11-07 DIAGNOSIS — O368330 Maternal care for abnormalities of the fetal heart rate or rhythm, third trimester, not applicable or unspecified: Secondary | ICD-10-CM | POA: Diagnosis not present

## 2020-11-07 DIAGNOSIS — O26893 Other specified pregnancy related conditions, third trimester: Secondary | ICD-10-CM | POA: Diagnosis present

## 2020-11-07 DIAGNOSIS — Z98891 History of uterine scar from previous surgery: Secondary | ICD-10-CM

## 2020-11-07 LAB — CBC
HCT: 38.9 % (ref 36.0–46.0)
HCT: 36.3 % (ref 36.0–46.0)
Hemoglobin: 12.4 g/dL (ref 12.0–15.0)
Hemoglobin: 11.7 g/dL — ABNORMAL LOW (ref 12.0–15.0)
MCH: 26.3 pg (ref 26.0–34.0)
MCH: 26.7 pg (ref 26.0–34.0)
MCHC: 31.9 g/dL (ref 30.0–36.0)
MCHC: 32.2 g/dL (ref 30.0–36.0)
MCV: 82.4 fL (ref 80.0–100.0)
MCV: 82.7 fL (ref 80.0–100.0)
Platelets: 379 10*3/uL (ref 150–400)
Platelets: 314 10*3/uL (ref 150–400)
RBC: 4.72 MIL/uL (ref 3.87–5.11)
RBC: 4.39 MIL/uL (ref 3.87–5.11)
RDW: 14.4 % (ref 11.5–15.5)
RDW: 14.3 % (ref 11.5–15.5)
WBC: 12.2 10*3/uL — ABNORMAL HIGH (ref 4.0–10.5)
WBC: 21.2 10*3/uL — ABNORMAL HIGH (ref 4.0–10.5)
nRBC: 0 % (ref 0.0–0.2)
nRBC: 0 % (ref 0.0–0.2)

## 2020-11-07 LAB — KLEIHAUER-BETKE STAIN
Fetal Cells %: 0 %
Quantitation Fetal Hemoglobin: 0 mL

## 2020-11-07 LAB — DIC (DISSEMINATED INTRAVASCULAR COAGULATION)PANEL
D-Dimer, Quant: 1.23 ug/mL-FEU — ABNORMAL HIGH (ref 0.00–0.50)
Fibrinogen: 553 mg/dL — ABNORMAL HIGH (ref 210–475)
INR: 0.9 (ref 0.8–1.2)
Platelets: 372 10*3/uL (ref 150–400)
Prothrombin Time: 11.7 seconds (ref 11.4–15.2)
Smear Review: NONE SEEN
aPTT: 29 seconds (ref 24–36)

## 2020-11-07 SURGERY — Surgical Case
Anesthesia: Spinal

## 2020-11-07 MED ORDER — BUPIVACAINE IN DEXTROSE 0.75-8.25 % IT SOLN
INTRATHECAL | Status: DC | PRN
  Administered 2020-11-07: 1.4 mL via INTRATHECAL

## 2020-11-07 MED ORDER — WITCH HAZEL-GLYCERIN EX PADS: 1.0000 "application " | MEDICATED_PAD | CUTANEOUS | Status: DC | PRN

## 2020-11-07 MED ORDER — DIPHENHYDRAMINE HCL 25 MG PO CAPS: 25.0000 mg | ORAL_CAPSULE | Freq: Four times a day (QID) | ORAL | Status: DC | PRN

## 2020-11-07 MED ORDER — SODIUM CHLORIDE 0.9% FLUSH: 3.0000 mL | INTRAVENOUS | Status: DC | PRN

## 2020-11-07 MED ORDER — HYDROMORPHONE HCL 1 MG/ML IJ SOLN
INTRAMUSCULAR | Status: AC
  Filled 2020-11-07: qty 1

## 2020-11-07 MED ORDER — NALBUPHINE HCL 10 MG/ML IJ SOLN: 5.0000 mg | INTRAMUSCULAR | Status: DC | PRN

## 2020-11-07 MED ORDER — SENNOSIDES-DOCUSATE SODIUM 8.6-50 MG PO TABS
2.0000 | ORAL_TABLET | ORAL | Status: DC
  Administered 2020-11-07 – 2020-11-08 (×2): 2 via ORAL
  Filled 2020-11-07 (×2): qty 2

## 2020-11-07 MED ORDER — SIMETHICONE 80 MG PO CHEW: 80.0000 mg | CHEWABLE_TABLET | ORAL | Status: DC

## 2020-11-07 MED ORDER — KETOROLAC TROMETHAMINE 30 MG/ML IJ SOLN
INTRAMUSCULAR | Status: AC
  Filled 2020-11-07: qty 1

## 2020-11-07 MED ORDER — OXYCODONE-ACETAMINOPHEN 5-325 MG PO TABS
1.0000 | ORAL_TABLET | ORAL | Status: DC | PRN
  Administered 2020-11-08 – 2020-11-09 (×4): 1 via ORAL
  Filled 2020-11-07 (×4): qty 1

## 2020-11-07 MED ORDER — FENTANYL CITRATE (PF) 100 MCG/2ML IJ SOLN
INTRAMUSCULAR | Status: DC | PRN
  Administered 2020-11-07: 15 ug via INTRATHECAL

## 2020-11-07 MED ORDER — KETOROLAC TROMETHAMINE 30 MG/ML IJ SOLN
30.0000 mg | Freq: Four times a day (QID) | INTRAMUSCULAR | Status: DC | PRN
  Administered 2020-11-07: 30 mg via INTRAMUSCULAR

## 2020-11-07 MED ORDER — DIPHENHYDRAMINE HCL 25 MG PO CAPS
25.0000 mg | ORAL_CAPSULE | ORAL | Status: DC | PRN
  Administered 2020-11-07: 25 mg via ORAL
  Filled 2020-11-07: qty 1

## 2020-11-07 MED ORDER — ZOLPIDEM TARTRATE 5 MG PO TABS: 5.0000 mg | ORAL_TABLET | Freq: Every evening | ORAL | Status: DC | PRN

## 2020-11-07 MED ORDER — MEPERIDINE HCL 25 MG/ML IJ SOLN: 6.2500 mg | INTRAMUSCULAR | Status: DC | PRN

## 2020-11-07 MED ORDER — OXYTOCIN-SODIUM CHLORIDE 30-0.9 UT/500ML-% IV SOLN: 2.5000 [IU]/h | INTRAVENOUS | Status: AC

## 2020-11-07 MED ORDER — IBUPROFEN 600 MG PO TABS
600.0000 mg | ORAL_TABLET | Freq: Four times a day (QID) | ORAL | Status: DC | PRN
  Administered 2020-11-08 – 2020-11-09 (×3): 600 mg via ORAL
  Filled 2020-11-07 (×3): qty 1

## 2020-11-07 MED ORDER — DIBUCAINE (PERIANAL) 1 % EX OINT: 1.0000 "application " | TOPICAL_OINTMENT | CUTANEOUS | Status: DC | PRN

## 2020-11-07 MED ORDER — MENTHOL 3 MG MT LOZG: 1.0000 | LOZENGE | OROMUCOSAL | Status: DC | PRN

## 2020-11-07 MED ORDER — HYDROMORPHONE HCL 1 MG/ML IJ SOLN
0.5000 mg | Freq: Once | INTRAMUSCULAR | Status: AC
  Administered 2020-11-07: 0.5 mg via INTRAVENOUS

## 2020-11-07 MED ORDER — SODIUM CHLORIDE 0.9 % IR SOLN
Status: DC | PRN
  Administered 2020-11-07 (×2): 1

## 2020-11-07 MED ORDER — DEXAMETHASONE SODIUM PHOSPHATE 4 MG/ML IJ SOLN
INTRAMUSCULAR | Status: DC | PRN
  Administered 2020-11-07: 4 mg via INTRAVENOUS

## 2020-11-07 MED ORDER — NALOXONE HCL 0.4 MG/ML IJ SOLN: 0.4000 mg | INTRAMUSCULAR | Status: DC | PRN

## 2020-11-07 MED ORDER — SIMETHICONE 80 MG PO CHEW: 80.0000 mg | CHEWABLE_TABLET | ORAL | Status: DC | PRN

## 2020-11-07 MED ORDER — PRENATAL MULTIVITAMIN CH
1.0000 | ORAL_TABLET | Freq: Every day | ORAL | Status: DC
  Administered 2020-11-07 – 2020-11-09 (×2): 1 via ORAL
  Filled 2020-11-07 (×2): qty 1

## 2020-11-07 MED ORDER — KETOROLAC TROMETHAMINE 30 MG/ML IJ SOLN: 30.0000 mg | Freq: Four times a day (QID) | INTRAMUSCULAR | Status: DC | PRN

## 2020-11-07 MED ORDER — TETANUS-DIPHTH-ACELL PERTUSSIS 5-2.5-18.5 LF-MCG/0.5 IM SUSY: 0.5000 mL | PREFILLED_SYRINGE | Freq: Once | INTRAMUSCULAR | Status: DC

## 2020-11-07 MED ORDER — KETOROLAC TROMETHAMINE 30 MG/ML IJ SOLN: 30.0000 mg | Freq: Once | INTRAMUSCULAR | Status: DC | PRN

## 2020-11-07 MED ORDER — LACTATED RINGERS IV SOLN: INTRAVENOUS | Status: DC

## 2020-11-07 MED ORDER — POVIDONE-IODINE 10 % EX SWAB
2.0000 "application " | Freq: Once | CUTANEOUS | Status: AC
  Administered 2020-11-07: 2 via TOPICAL

## 2020-11-07 MED ORDER — SCOPOLAMINE 1 MG/3DAYS TD PT72
1.0000 | MEDICATED_PATCH | Freq: Once | TRANSDERMAL | Status: DC
  Administered 2020-11-07: 1.5 mg via TRANSDERMAL

## 2020-11-07 MED ORDER — ONDANSETRON HCL 4 MG/2ML IJ SOLN: 4.0000 mg | Freq: Three times a day (TID) | INTRAMUSCULAR | Status: DC | PRN

## 2020-11-07 MED ORDER — COCONUT OIL OIL: 1.0000 "application " | TOPICAL_OIL | Status: DC | PRN

## 2020-11-07 MED ORDER — DIPHENHYDRAMINE HCL 50 MG/ML IJ SOLN: 12.5000 mg | INTRAMUSCULAR | Status: DC | PRN

## 2020-11-07 MED ORDER — PHENYLEPHRINE HCL-NACL 20-0.9 MG/250ML-% IV SOLN
INTRAVENOUS | Status: DC | PRN
  Administered 2020-11-07: 60 ug/min via INTRAVENOUS

## 2020-11-07 MED ORDER — MORPHINE SULFATE (PF) 0.5 MG/ML IJ SOLN
INTRAMUSCULAR | Status: DC | PRN
  Administered 2020-11-07: .15 mg via INTRATHECAL

## 2020-11-07 MED ORDER — CEFAZOLIN SODIUM-DEXTROSE 2-4 GM/100ML-% IV SOLN
2.0000 g | INTRAVENOUS | Status: AC
  Administered 2020-11-07: 2 g via INTRAVENOUS

## 2020-11-07 MED ORDER — HYDROMORPHONE HCL 1 MG/ML IJ SOLN: 0.2500 mg | INTRAMUSCULAR | Status: DC | PRN

## 2020-11-07 MED ORDER — OXYTOCIN-SODIUM CHLORIDE 30-0.9 UT/500ML-% IV SOLN
INTRAVENOUS | Status: DC | PRN
  Administered 2020-11-07: 100 mL via INTRAVENOUS
  Administered 2020-11-07: 200 mL via INTRAVENOUS

## 2020-11-07 MED ORDER — NALBUPHINE HCL 10 MG/ML IJ SOLN: 5.0000 mg | Freq: Once | INTRAMUSCULAR | Status: DC | PRN

## 2020-11-07 MED ORDER — ACETAMINOPHEN 500 MG PO TABS
1000.0000 mg | ORAL_TABLET | Freq: Four times a day (QID) | ORAL | Status: AC
  Administered 2020-11-07 – 2020-11-08 (×4): 1000 mg via ORAL
  Filled 2020-11-07 (×4): qty 2

## 2020-11-07 MED ORDER — NALOXONE HCL 4 MG/10ML IJ SOLN
1.0000 ug/kg/h | INTRAVENOUS | Status: DC | PRN
  Filled 2020-11-07: qty 5

## 2020-11-07 MED ORDER — LACTATED RINGERS IV SOLN: INTRAVENOUS | Status: DC | PRN

## 2020-11-07 MED ORDER — SCOPOLAMINE 1 MG/3DAYS TD PT72
MEDICATED_PATCH | TRANSDERMAL | Status: AC
  Filled 2020-11-07: qty 1

## 2020-11-07 MED ORDER — ONDANSETRON HCL 4 MG/2ML IJ SOLN
INTRAMUSCULAR | Status: DC | PRN
  Administered 2020-11-07: 4 mg via INTRAVENOUS

## 2020-11-07 MED ORDER — SOD CITRATE-CITRIC ACID 500-334 MG/5ML PO SOLN
30.0000 mL | ORAL | Status: AC
  Administered 2020-11-07: 30 mL via ORAL
  Filled 2020-11-07: qty 15

## 2020-11-07 MED ORDER — SIMETHICONE 80 MG PO CHEW
80.0000 mg | CHEWABLE_TABLET | Freq: Three times a day (TID) | ORAL | Status: DC
  Administered 2020-11-07 – 2020-11-09 (×6): 80 mg via ORAL
  Filled 2020-11-07 (×8): qty 1

## 2020-11-07 MED ORDER — PROMETHAZINE HCL 25 MG/ML IJ SOLN: 6.2500 mg | INTRAMUSCULAR | Status: DC | PRN

## 2020-11-07 SURGICAL SUPPLY — 41 items
BARRIER ADHS 3X4 INTERCEED (GAUZE/BANDAGES/DRESSINGS) IMPLANT
BENZOIN TINCTURE PRP APPL 2/3 (GAUZE/BANDAGES/DRESSINGS) ×3 IMPLANT
CHLORAPREP W/TINT 26ML (MISCELLANEOUS) ×3 IMPLANT
CLAMP CORD UMBIL (MISCELLANEOUS) IMPLANT
CLOSURE STERI STRIP 1/2 X4 (GAUZE/BANDAGES/DRESSINGS) ×2 IMPLANT
CLOSURE WOUND 1/2 X4 (GAUZE/BANDAGES/DRESSINGS) ×1
CLOTH BEACON ORANGE TIMEOUT ST (SAFETY) ×3 IMPLANT
DRSG OPSITE POSTOP 4X10 (GAUZE/BANDAGES/DRESSINGS) ×3 IMPLANT
ELECT REM PT RETURN 9FT ADLT (ELECTROSURGICAL) ×3
ELECTRODE REM PT RTRN 9FT ADLT (ELECTROSURGICAL) ×1 IMPLANT
EXTRACTOR VACUUM KIWI (MISCELLANEOUS) IMPLANT
GLOVE BIOGEL M 7.0 STRL (GLOVE) ×6 IMPLANT
GLOVE BIOGEL PI IND STRL 7.0 (GLOVE) ×3 IMPLANT
GLOVE BIOGEL PI INDICATOR 7.0 (GLOVE) ×6
GOWN STRL REUS W/TWL LRG LVL3 (GOWN DISPOSABLE) ×9 IMPLANT
HEMOSTAT SURGICEL 4X8 (HEMOSTASIS) ×3 IMPLANT
KIT ABG SYR 3ML LUER SLIP (SYRINGE) IMPLANT
NEEDLE HYPO 25X5/8 SAFETYGLIDE (NEEDLE) ×3 IMPLANT
NS IRRIG 1000ML POUR BTL (IV SOLUTION) ×3 IMPLANT
PACK C SECTION WH (CUSTOM PROCEDURE TRAY) ×3 IMPLANT
PAD ABD 7.5X8 STRL (GAUZE/BANDAGES/DRESSINGS) ×3 IMPLANT
PAD OB MATERNITY 4.3X12.25 (PERSONAL CARE ITEMS) ×3 IMPLANT
PENCIL SMOKE EVAC W/HOLSTER (ELECTROSURGICAL) ×3 IMPLANT
RTRCTR C-SECT PINK 25CM LRG (MISCELLANEOUS) IMPLANT
SPONGE GAUZE 4X4 12PLY STER LF (GAUZE/BANDAGES/DRESSINGS) ×6 IMPLANT
STRIP CLOSURE SKIN 1/2X4 (GAUZE/BANDAGES/DRESSINGS) ×2 IMPLANT
SUT PDS AB 0 CT1 27 (SUTURE) ×6 IMPLANT
SUT PLAIN 0 NONE (SUTURE) IMPLANT
SUT PLAIN 2 0 (SUTURE) ×6
SUT PLAIN ABS 2-0 CT1 27XMFL (SUTURE) ×3 IMPLANT
SUT VIC AB 0 CTX 36 (SUTURE) ×6
SUT VIC AB 0 CTX36XBRD ANBCTRL (SUTURE) ×3 IMPLANT
SUT VIC AB 2-0 CT1 27 (SUTURE) ×6
SUT VIC AB 2-0 CT1 TAPERPNT 27 (SUTURE) ×3 IMPLANT
SUT VIC AB 3-0 SH 27 (SUTURE)
SUT VIC AB 3-0 SH 27X BRD (SUTURE) IMPLANT
SUT VIC AB 4-0 KS 27 (SUTURE) ×3 IMPLANT
SYR 3ML 25GX5/8 SAFETY (SYRINGE) ×3 IMPLANT
TOWEL OR 17X24 6PK STRL BLUE (TOWEL DISPOSABLE) ×3 IMPLANT
TRAY FOLEY W/BAG SLVR 14FR LF (SET/KITS/TRAYS/PACK) ×3 IMPLANT
WATER STERILE IRR 1000ML POUR (IV SOLUTION) ×6 IMPLANT

## 2020-11-07 NOTE — Anesthesia Procedure Notes (Signed)
Spinal  Patient location during procedure: OR Start time: 11/07/2020 3:22 AM End time: 11/07/2020 3:26 AM Staffing Performed: anesthesiologist  Anesthesiologist: Leilani Able, MD Preanesthetic Checklist Completed: patient identified, IV checked, site marked, risks and benefits discussed, surgical consent, monitors and equipment checked, pre-op evaluation and timeout performed Spinal Block Patient position: sitting Prep: DuraPrep and site prepped and draped Patient monitoring: continuous pulse ox and blood pressure Approach: midline Location: L3-4 Injection technique: single-shot Needle Needle type: Pencan  Needle gauge: 24 G Needle length: 10 cm Needle insertion depth: 6 cm Assessment Sensory level: T4

## 2020-11-07 NOTE — Anesthesia Preprocedure Evaluation (Signed)
Anesthesia Evaluation  Patient identified by MRN, date of birth, ID band Patient awake    Reviewed: Allergy & Precautions, NPO status , Patient's Chart, lab work & pertinent test results  Airway Mallampati: II       Dental no notable dental hx.    Pulmonary asthma , Current Smoker and Patient abstained from smoking.,    Pulmonary exam normal        Cardiovascular negative cardio ROS Normal cardiovascular exam     Neuro/Psych negative neurological ROS  negative psych ROS   GI/Hepatic negative GI ROS, Neg liver ROS,   Endo/Other  negative endocrine ROS  Renal/GU negative Renal ROS  negative genitourinary   Musculoskeletal negative musculoskeletal ROS (+)   Abdominal (+) + obese,   Peds negative pediatric ROS (+)  Hematology negative hematology ROS (+)   Anesthesia Other Findings   Reproductive/Obstetrics negative OB ROS (+) Pregnancy                             Anesthesia Physical  Anesthesia Plan  ASA: II  Anesthesia Plan: Spinal   Post-op Pain Management:    Induction:   PONV Risk Score and Plan: 2 and Ondansetron, Dexamethasone and Scopolamine patch - Pre-op  Airway Management Planned: Natural Airway and Nasal Cannula  Additional Equipment: None  Intra-op Plan:   Post-operative Plan:   Informed Consent: I have reviewed the patients History and Physical, chart, labs and discussed the procedure including the risks, benefits and alternatives for the proposed anesthesia with the patient or authorized representative who has indicated his/her understanding and acceptance.       Plan Discussed with: CRNA  Anesthesia Plan Comments:         Anesthesia Quick Evaluation

## 2020-11-07 NOTE — Op Note (Signed)
Cesarean Section Procedure Note   Connie Buchanan  11/07/2020  Indications: Scheduled Proceedure/Maternal Request and SROM AND NRFHTS   Pre-operative Diagnosis: repeat cesarean section; ruptured membranes, nonreassuring fetal tracing.   Post-operative Diagnosis: WITH UTERINE RUPTURE   Surgeon Moe Brier  Assistants:[facility] CNM  Anesthesia: spinal   Procedure Details:  The patient was seen in the Holding Room. The risks, benefits, complications, treatment options, and expected outcomes were discussed with the patient. The patient concurred with the proposed plan, giving informed consent. identified as Connie Buchanan and the procedure verified as C-Section Delivery. A Time Out was held and the above information confirmed.  After induction of anesthesia, the patient was draped and prepped in the usual sterile manner. A transverse incision was made and carried down through the subcutaneous tissue to the fascia. Fascial incision was made and extended transversely. The fascia was separated from the underlying rectus tissue superiorly and inferiorly. The peritoneum was identified and entered. Peritoneal incision was extended longitudinally. The utero-vesical peritoneal reflection was incised transversely and the bladder flap was bluntly freed from the lower uterine segment. A small opening was seen in the lower  t uterine segment.  This was extended bluntly .  Delivered from cephalic presentation was a  pound Female with Apgar scores of not yet documented at one minute and not documented at five minutes. A small band connecting the lower uterine segment and rupture on the patients left was cut with scissors.   Cord ph was arterial 7.09 the umbilical cord was clamped and cut cord blood was obtained for evaluation. The placenta was removed Intact and appeared normal.  The uterine incision was closed with running locked sutures of 0Vicryl. A second layer of 0 vicryl was used to imbricate the  uterus. Several figure eight sutre used to obtain hemostasis.    The patients left fallopian tube was grasped, freed with metzenbaum scissors form adhesion to the uterus,  at the mid ishtmic portion with babcock clamp, ligated with 2-0 plain and excised.  The patients right fallopian tube was followed out to the fimbriated end.  The mid isthmic portion of the tube was ligated with 2-0 plain and excised.  Both portions of tubes was sent to pathology.     Hemostasis was observed. Lavage was carried out until clear. The fascia was then reapproximated with running sutures of 0Vicryl. The subcuticular closure was performed using 2-0plain gut. The skin was closed with 4-78monocryl.   Instrument, sponge, and needle counts were correct prior the abdominal closure and were correct at the conclusion of the case.    Findings: uterine rupture on right side babies arm and umbilical cord seen coming through the incision.  Clear fluid.  Left tube adherant to the uterus. Normal ovaries B   Estimated Blood Loss 430cc  Total IV Fluids:   Urine Output: 50CC OF clear urine  Specimens: placenta sent to pathology  Complications: no complications  Disposition: PACU - hemodynamically stable.   Maternal Condition: stable   Baby condition / location:  Couplet care / Skin to Skin  Attending Attestation: I performed the procedure.   Signed: Surgeon(s): Jaymes Graff, MD

## 2020-11-07 NOTE — Progress Notes (Signed)
PT asked to turn to side. Asked for a minute and then was able to tilt to R side. Pillow to back.

## 2020-11-07 NOTE — Progress Notes (Signed)
Pt seen came in with SROM at midnight.  She then began to have very painful contractions BP 131/72   Pulse (!) 107   Temp 98.7 F (37.1 C) (Oral)   Resp 18  FHTS 140 with decreased variabililty and occ moderate variables toco q2-3 minutes NRHTS SROM  Labor Pt for repeat CS and BTL R&B reviewed Date of Initial H&P: 11/06/20  History reviewed, patient examined, no change in status, stable for surgery.

## 2020-11-07 NOTE — Transfer of Care (Signed)
Immediate Anesthesia Transfer of Care Note  Patient: Connie Buchanan  Procedure(s) Performed: REPEAT CESAREAN SECTION (N/A )  Patient Location: PACU  Anesthesia Type:Spinal  Level of Consciousness: awake, alert  and oriented  Airway & Oxygen Therapy: none Post-op Assessment: Report given to RN and Post -op Vital signs reviewed and stable  Post vital signs: Reviewed and stable  Last Vitals:  Vitals Value Taken Time  BP 101/61 11/07/20 0504  Temp    Pulse 80 11/07/20 0507  Resp 22 11/07/20 0507  SpO2 98 % 11/07/20 0507  Vitals shown include unvalidated device data.  Last Pain:  Vitals:   11/07/20 0238  TempSrc:   PainSc: 10-Worst pain ever      Patients Stated Pain Goal: 0 (11/07/20 0222)  Complications: No complications documented.

## 2020-11-07 NOTE — H&P (Signed)
Chief Complaint:  Rupture of Membranes   Event Date/Time   First Provider Initiated Contact with Patient 11/07/20 0231     HPI: Connie Buchanan is a 29 y.o. G2P1 at 64w0dwho presents to maternity admissions reporting leaking of fluid just prior to arrival with onset of severe mid and upper abdominal pain and tightness. Is scheduled for a repeat C/S this morning.  . She reports good fetal movement, denies LOF, vaginal bleeding, vaginal itching/burning, urinary symptoms, h/a, dizziness, n/v, diarrhea, constipation or fever/chills.  She denies headache, visual changes or RUQ abdominal pain.  Abdominal Pain This is a new problem. The current episode started today. The onset quality is sudden. The problem occurs constantly. The problem has been unchanged. The pain is located in the periumbilical region and epigastric region. The pain is at a severity of 10/10. The pain is severe. The quality of the pain is sharp and cramping. The abdominal pain does not radiate. Pertinent negatives include no fever, headaches, myalgias, nausea or vomiting. Nothing aggravates the pain. The pain is relieved by nothing. She has tried nothing for the symptoms.  Vaginal Discharge The patient's primary symptoms include pelvic pain and vaginal discharge. The patient's pertinent negatives include no genital itching, genital lesions or genital odor. This is a new problem. The current episode started today. The problem occurs constantly. The problem has been unchanged. Associated symptoms include abdominal pain. Pertinent negatives include no fever, headaches, nausea or vomiting. The vaginal discharge was watery and clear. There has been no bleeding. She has not been passing clots. She has not been passing tissue. Nothing aggravates the symptoms. She has tried nothing for the symptoms.     Past Medical History:     Past Medical History:  Diagnosis Date  . Bartholin gland cyst   . History of asthma     Past  obstetric history:                 OB History  Gravida Para Term Preterm AB Living  2 1          SAB IAB Ectopic Multiple Live Births                     # Outcome Date GA Lbr Len/2nd Weight Sex Delivery Anes PTL Lv  2 Current           1 Para      CS-LTranv       Past Surgical History:      Past Surgical History:  Procedure Laterality Date  . BARTHOLIN CYST MARSUPIALIZATION N/A 11/17/2019   Procedure: BARTHOLIN CYST MARSUPIALIZATION;  Surgeon: Gerald Leitz, MD;  Location: [facility];  Service: Gynecology;  Laterality: N/A;  . CESAREAN SECTION N/A 06/18/2018   Procedure: CESAREAN SECTION;  Surgeon: Myna Hidalgo, DO;  Location: [facility];  Service: Obstetrics;  Laterality: N/A;  . TONSILLECTOMY  age 68  . WISDOM TOOTH EXTRACTION  2020    Family History:      Family History  Problem Relation Age of Onset  . Depression Mother   . Asthma Mother   . Diabetes Mother   . Hypertension Mother   . Alcohol abuse Father   . Hypertension Father   . Diabetes Father   . Alcohol abuse Brother   . Lung cancer Maternal Grandmother   . Breast cancer Maternal Grandmother     Social History: Social History        Tobacco Use  . Smoking status:  Current Every Day Smoker    Packs/day: 0.50    Years: 13.00    Pack years: 6.50    Types: Cigarettes  . Smokeless tobacco: Never Used  Vaping Use  . Vaping Use: Never used  Substance Use Topics  . Alcohol use: No  . Drug use: Never    Allergies: No Known Allergies  Meds:         Medications Prior to Admission  Medication Sig Dispense Refill Last Dose  . acetaminophen (TYLENOL) 500 MG tablet Take 500 mg by mouth every 6 (six) hours as needed.     Marland Kitchen ESOMEPRAZOLE MAGNESIUM PO Take by mouth daily.      . Prenatal Vit-Fe Fumarate-FA (PRENATAL MULTIVITAMIN) TABS tablet Take 1 tablet by mouth daily at 12 noon.       I have reviewed patient's Past  Medical Hx, Surgical Hx, Family Hx, Social Hx, medications and allergies.   ROS:  Review of Systems  Constitutional: Negative for fever.  Gastrointestinal: Positive for abdominal pain. Negative for nausea and vomiting.  Genitourinary: Positive for pelvic pain and vaginal discharge.  Musculoskeletal: Negative for myalgias.  Neurological: Negative for headaches.   Other systems negative  Physical Exam      Vitals:   11/07/20 0135  BP: 131/72  Pulse: (!) 107  Resp: 18  Temp: 98.7 F (37.1 C)  TempSrc: Oral    Constitutional: Well-developed, well-nourished female in no acute distress, but crying out in pain. .  Cardiovascular: normal rate and rhythm Respiratory: normal effort, clear to auscultation bilaterally GI: Abd soft, non-tender, gravid appropriate for gestational age.   No rebound or guarding. MS: Extremities nontender, no edema, normal ROM Neurologic: Alert and oriented x 4.  GU: Neg CVAT.  PELVIC EXAM: Cervix pink, visually closed, without lesion, scant watery discharge, vaginal walls and external genitalia normal    FERN POSITIVE  Dilation: 2 Effacement (%): 80 Station: -3 Presentation: Vertex Exam by:: Georgia Dom, cnm   FHT:  Baseline 147-150 , minimal variability, accelerations absent, late and variable  decelerations Contractions: q 2 mins with irritability between   Labs: Lab Results Last 24 Hours       Results for orders placed or performed during the hospital encounter of 11/07/20 (from the past 24 hour(s))  CBC     Status: Abnormal   Collection Time: 11/07/20  2:21 AM  Result Value Ref Range   WBC 12.2 (H) 4.0 - 10.5 K/uL   RBC 4.72 3.87 - 5.11 MIL/uL   Hemoglobin 12.4 12.0 - 15.0 g/dL   HCT 61.6 07.3 - 71.0 %   MCV 82.4 80.0 - 100.0 fL   MCH 26.3 26.0 - 34.0 pg   MCHC 31.9 30.0 - 36.0 g/dL   RDW 62.6 94.8 - 54.6 %   Platelets 379 150 - 400 K/uL   nRBC 0.0 0.0 - 0.2 %  DIC Panel (Not at [facility]) ONCE - STAT     Status:  None (Preliminary result)   Collection Time: 11/07/20  2:21 AM  Result Value Ref Range   Prothrombin Time PENDING 11.4 - 15.2 seconds   INR PENDING 0.8 - 1.2   aPTT PENDING 24 - 36 seconds   Fibrinogen PENDING 210 - 475 mg/dL   D-Dimer, Quant PENDING 0.00 - 0.50 ug/mL-FEU   Platelets 372 150 - 400 K/uL   Smear Review PENDING       --/--/A POS (12/07 2703)  Imaging:  Official reading pending There is an area outside myometrium of  some type of fluid that evolved during the scanning process. It started out as very thin line and progressed to bigger pocket. MFM Dr Grace Bushy is going to review the films  MAU Course/MDM: I have ordered labs and reviewed results.  NST reviewed, Category 2 then 3 Consult Dr Normand Sloop with presentation, exam findings and test results. She arrived to proceed with urgent cesarean delivery Treatments in MAU included 2 large bore IVs started, prepped for surgery, EFM, Analgesia.    Assessment: 1. Abdominal pain affecting pregnancy   2.      Single IUP at [redacted]w[redacted]d 3.      Premature rupture of membranes 4.      nonreassuring fetal heart rate pattern  Plan: Admit to OR Cesarean Delivery per Dr Normand Sloop MD to follow  Wynelle Bourgeois CNM, MSN Certified Nurse-Midwife 11/07/2020 2:31 AM    History as ABOVE pt underwent an emergent repeat cesarean section with bilateral tubal ligation by Dr. Normand Sloop due to Placental abruption. See operative note

## 2020-11-07 NOTE — MAU Provider Note (Signed)
Chief Complaint:  Rupture of Membranes   Event Date/Time   First Provider Initiated Contact with Patient 11/07/20 0231     HPI: Connie Buchanan is a 29 y.o. G2P1 at 66w0dwho presents to maternity admissions reporting leaking of fluid just prior to arrival with onset of severe mid and upper abdominal pain and tightness. Is scheduled for a repeat C/S this morning.  . She reports good fetal movement, denies LOF, vaginal bleeding, vaginal itching/burning, urinary symptoms, h/a, dizziness, n/v, diarrhea, constipation or fever/chills.  She denies headache, visual changes or RUQ abdominal pain.  Abdominal Pain This is a new problem. The current episode started today. The onset quality is sudden. The problem occurs constantly. The problem has been unchanged. The pain is located in the periumbilical region and epigastric region. The pain is at a severity of 10/10. The pain is severe. The quality of the pain is sharp and cramping. The abdominal pain does not radiate. Pertinent negatives include no fever, headaches, myalgias, nausea or vomiting. Nothing aggravates the pain. The pain is relieved by nothing. She has tried nothing for the symptoms.  Vaginal Discharge The patient's primary symptoms include pelvic pain and vaginal discharge. The patient's pertinent negatives include no genital itching, genital lesions or genital odor. This is a new problem. The current episode started today. The problem occurs constantly. The problem has been unchanged. Associated symptoms include abdominal pain. Pertinent negatives include no fever, headaches, nausea or vomiting. The vaginal discharge was watery and clear. There has been no bleeding. She has not been passing clots. She has not been passing tissue. Nothing aggravates the symptoms. She has tried nothing for the symptoms.     Past Medical History: Past Medical History:  Diagnosis Date  . Bartholin gland cyst   . History of asthma     Past obstetric history: OB  History  Gravida Para Term Preterm AB Living  2 1          SAB IAB Ectopic Multiple Live Births               # Outcome Date GA Lbr Len/2nd Weight Sex Delivery Anes PTL Lv  2 Current           1 Para      CS-LTranv       Past Surgical History: Past Surgical History:  Procedure Laterality Date  . BARTHOLIN CYST MARSUPIALIZATION N/A 11/17/2019   Procedure: BARTHOLIN CYST MARSUPIALIZATION;  Surgeon: Gerald Leitz, MD;  Location: [facility];  Service: Gynecology;  Laterality: N/A;  . CESAREAN SECTION N/A 06/18/2018   Procedure: CESAREAN SECTION;  Surgeon: Myna Hidalgo, DO;  Location: [facility];  Service: Obstetrics;  Laterality: N/A;  . TONSILLECTOMY  age 73  . WISDOM TOOTH EXTRACTION  2020    Family History: Family History  Problem Relation Age of Onset  . Depression Mother   . Asthma Mother   . Diabetes Mother   . Hypertension Mother   . Alcohol abuse Father   . Hypertension Father   . Diabetes Father   . Alcohol abuse Brother   . Lung cancer Maternal Grandmother   . Breast cancer Maternal Grandmother     Social History: Social History   Tobacco Use  . Smoking status: Current Every Day Smoker    Packs/day: 0.50    Years: 13.00    Pack years: 6.50    Types: Cigarettes  . Smokeless tobacco: Never Used  Vaping Use  . Vaping Use: Never used  Substance Use Topics  . Alcohol use: No  . Drug use: Never    Allergies: No Known Allergies  Meds:  Medications Prior to Admission  Medication Sig Dispense Refill Last Dose  . acetaminophen (TYLENOL) 500 MG tablet Take 500 mg by mouth every 6 (six) hours as needed.     Marland Kitchen ESOMEPRAZOLE MAGNESIUM PO Take by mouth daily.      . Prenatal Vit-Fe Fumarate-FA (PRENATAL MULTIVITAMIN) TABS tablet Take 1 tablet by mouth daily at 12 noon.       I have reviewed patient's Past Medical Hx, Surgical Hx, Family Hx, Social Hx, medications and allergies.   ROS:  Review of Systems  Constitutional: Negative for  fever.  Gastrointestinal: Positive for abdominal pain. Negative for nausea and vomiting.  Genitourinary: Positive for pelvic pain and vaginal discharge.  Musculoskeletal: Negative for myalgias.  Neurological: Negative for headaches.   Other systems negative  Physical Exam   Vitals:   11/07/20 0135  BP: 131/72  Pulse: (!) 107  Resp: 18  Temp: 98.7 F (37.1 C)  TempSrc: Oral    Constitutional: Well-developed, well-nourished female in no acute distress, but crying out in pain. .  Cardiovascular: normal rate and rhythm Respiratory: normal effort, clear to auscultation bilaterally GI: Abd soft, non-tender, gravid appropriate for gestational age.   No rebound or guarding. MS: Extremities nontender, no edema, normal ROM Neurologic: Alert and oriented x 4.  GU: Neg CVAT.  PELVIC EXAM: Cervix pink, visually closed, without lesion, scant watery discharge, vaginal walls and external genitalia normal    FERN POSITIVE  Dilation: 2 Effacement (%): 80 Station: -3 Presentation: Vertex Exam by:: Georgia Dom, cnm   FHT:  Baseline 147-150 , minimal variability, accelerations absent, late and variable  decelerations Contractions: q 2 mins with irritability between   Labs: Results for orders placed or performed during the hospital encounter of 11/07/20 (from the past 24 hour(s))  CBC     Status: Abnormal   Collection Time: 11/07/20  2:21 AM  Result Value Ref Range   WBC 12.2 (H) 4.0 - 10.5 K/uL   RBC 4.72 3.87 - 5.11 MIL/uL   Hemoglobin 12.4 12.0 - 15.0 g/dL   HCT 78.2 42.3 - 53.6 %   MCV 82.4 80.0 - 100.0 fL   MCH 26.3 26.0 - 34.0 pg   MCHC 31.9 30.0 - 36.0 g/dL   RDW 14.4 31.5 - 40.0 %   Platelets 379 150 - 400 K/uL   nRBC 0.0 0.0 - 0.2 %  DIC Panel (Not at [facility] ONCE - STAT     Status: None (Preliminary result)   Collection Time: 11/07/20  2:21 AM  Result Value Ref Range   Prothrombin Time PENDING 11.4 - 15.2 seconds   INR PENDING 0.8 - 1.2   aPTT PENDING 24 - 36 seconds    Fibrinogen PENDING 210 - 475 mg/dL   D-Dimer, Quant PENDING 0.00 - 0.50 ug/mL-FEU   Platelets 372 150 - 400 K/uL   Smear Review PENDING     --/--/A POS (12/07 8676)  Imaging:  Official reading pending There is an area outside myometrium of some type of fluid that evolved during the scanning process. It started out as very thin line and progressed to bigger pocket. MFM Dr Grace Bushy is going to review the films  MAU Course/MDM: I have ordered labs and reviewed results.  NST reviewed, Category 2 then 3 Consult Dr Normand Sloop with presentation, exam findings and test results. She arrived to proceed with urgent  cesarean delivery Treatments in MAU included 2 large bore IVs started, prepped for surgery, EFM, Analgesia.    Assessment: 1. Abdominal pain affecting pregnancy   2.      Single IUP at [redacted]w[redacted]d 3.      Premature rupture of membranes 4.      nonreassuring fetal heart rate pattern  Plan: Admit to OR Cesarean Delivery per Dr Normand Sloop MD to follow  Wynelle Bourgeois CNM, MSN Certified Nurse-Midwife 11/07/2020 2:31 AM

## 2020-11-07 NOTE — Progress Notes (Signed)
To OR via stretcher

## 2020-11-08 ENCOUNTER — Other Ambulatory Visit: Payer: Self-pay

## 2020-11-08 NOTE — Progress Notes (Signed)
Postpartum Note Day #1  S:  Patient doing well.  Pain controlled.  Tolerating regular diet.   Ambulating and voiding without difficulty.   Denies fevers, chills, chest pain, SOB, N/V, or worsening bilateral LE edema.  Lochia: Minimal Infant feeding:  Bottle Circumcision:  Desires Contraception:  S/p BTL  O: Temp:  [97.8 F (36.6 C)-98.2 F (36.8 C)] 98.1 F (36.7 C) (12/10 0514) Pulse Rate:  [66-78] 78 (12/10 0514) Resp:  [17-19] 17 (12/10 0514) BP: (102-126)/(47-68) 126/68 (12/10 0514) SpO2:  [95 %-100 %] 100 % (12/10 0514) Gen: NAD, pleasant and cooperative Resp: No increased work of breathing Abdomen: soft, non-distended, non-tender throughout Uterus: firm, non-tender, below umbilicus Incision: c/d/i, bandage in place Ext: trace bilateral LE edema, no bilateral calf tenderness  Labs: Recent Labs    11/07/20 0221 11/07/20 0748  HGB 12.4 11.7*    A/P: Patient is a 29 y.o. G2P1 POD#1 s/p LTCS + BTL.  - Pain well controlled  - GU: UOP is adequate - GI: Tolerating regular diet - Activity: encouraged sitting up to chair and ambulation as tolerated - DVT Prophylaxis: SCDs - Labs: stable as above  Disposition:  D/C home POD#2-3  Steva Ready, DO [phone] (office)

## 2020-11-09 ENCOUNTER — Encounter (HOSPITAL_COMMUNITY): Payer: Self-pay | Admitting: Obstetrics and Gynecology

## 2020-11-09 MED ORDER — OXYCODONE HCL 5 MG PO TABS
5.0000 mg | ORAL_TABLET | Freq: Three times a day (TID) | ORAL | 0 refills | Status: AC | PRN
Start: 2020-11-09 — End: 2021-11-09

## 2020-11-09 MED ORDER — MEASLES, MUMPS & RUBELLA VAC IJ SOLR
0.5000 mL | Freq: Once | INTRAMUSCULAR | Status: AC
  Administered 2020-11-09: 13:00:00 0.5 mL via SUBCUTANEOUS

## 2020-11-09 NOTE — Progress Notes (Signed)
Discharge instructions given to patient. Discussed follow up appointment in 2 weeks and 6 weeks, postpartum care, medication times, and signs and symptoms of hypertension. Patient verbalized understanding.

## 2020-11-09 NOTE — Discharge Summary (Signed)
Cesarean Section with Sterilization OB Discharge Summary  Patient Name: Connie Buchanan DOB: 1991-10-02 MRN: [id number]  Date of admission: 11/07/2020 Intrauterine pregnancy: [redacted]w[redacted]d   Admitting diagnosis: S/P cesarean section [Z98.891] H/O cesarean section complicating pregnancy [O34.219] Secondary diagnosis: Uterine Rupture  Date of discharge: 11/09/2020    Discharge diagnosis: Term Pregnancy Delivered     Prenatal history: G2P1001  EDC : 11/14/2020, by Other Basis  Prenatal care at [facility]  Primary provider : Deboraha Sprang Prenatal course complicated by uterine rupture, Cesarean Section  Prenatal Labs: ABO, Rh: --/--/A POS (12/07 0859) Antibody: NEG (12/07 0859) Rubella: Nonimmune (05/10 0000)   RPR: NON REACTIVE (12/07 0851)  HBsAg: Negative (05/10 0000)  HIV: Non-reactive (05/10 0000)                                     Hospital course:  Sceduled C/S   29 y.o. yo G2P1000 at [redacted]w[redacted]d was admitted to the hospital 11/07/2020 for scheduled cesarean section with the following indication:Non-Reassuring FHR.Delivery details are as follows:  Membrane Rupture Time/Date: 12:30 AM ,11/07/2020   Delivery Method:C-Section, Low Transverse  Details of operation can be found in separate operative note.  Patient had an uncomplicated postpartum course.  She is ambulating, tolerating a regular diet, passing flatus, and urinating well. Patient is discharged home in stable condition on  11/09/20        Newborn Data: Birth date:11/07/2020  Birth time:3:49 AM  Gender:Female  Living status:  Apgars:1 ,9  SNKNLZ:7673 g    Delivering PROVIDER: Jaymes Graff                                                            Complications: Uterine Rupture  Newborn Data: Live born female  Birth Weight: 8 lb 2 oz (3685 g) APGAR: 1, 9  Newborn Delivery   Birth date/time: 11/07/2020 03:49:00 Delivery type: C-Section, Low Transverse Trial of labor: No C-section categorization: Repeat      Baby Feeding:  Breast Disposition:home with mother  Post partum procedures:N/A  Labs: Lab Results  Component Value Date   WBC 21.2 (H) 11/07/2020   HGB 11.7 (L) 11/07/2020   HCT 36.3 11/07/2020   MCV 82.7 11/07/2020   PLT 314 11/07/2020   No flowsheet data found.  Physical Exam @ time of discharge:  Vitals:   11/08/20 2027 11/08/20 2342 11/09/20 0539 11/09/20 0813  BP: 123/69 (!) 124/44 123/72 118/71  Pulse: 88 87 90 78  Resp: 20 16 15 18   Temp: 98 F (36.7 C) 98.1 F (36.7 C) 97.9 F (36.6 C) 98.1 F (36.7 C)  TempSrc: Oral Oral Oral Oral  SpO2: 98% 98% 99% 98%   general: alert, cooperative and no distress lochia: appropriate uterine fundus: firm perineum: intact incision: Dressing is clean, dry, and intact extremities: DVT Evaluation: No evidence of DVT seen on physical exam.  Discharge instructions:  "Baby and Me Booklet" and [facility] Booklet Discharge Medications:  Allergies as of 11/09/2020   No Known Allergies     Medication List    TAKE these medications   acetaminophen 500 MG tablet Commonly known as: TYLENOL Take 500 mg by mouth every 6 (six) hours as needed.   ESOMEPRAZOLE MAGNESIUM  PO Take by mouth daily.   oxyCODONE 5 MG immediate release tablet Commonly known as: Roxicodone Take 1 tablet (5 mg total) by mouth every 8 (eight) hours as needed.   prenatal multivitamin Tabs tablet Take 1 tablet by mouth daily at 12 noon.            Discharge Care Instructions  (From admission, onward)         Start     Ordered   11/09/20 0000  If the dressing is still on your incision site when you go home, remove it on the third day after your surgery date. Remove dressing if it begins to fall off, or if it is dirty or damaged before the third day.        11/09/20 0827         Diet: routine diet Activity: Advance as tolerated. Pelvic rest x 6 weeks.  Follow up:2 weeks  Dr Sallye Ober aware of Pt status and POC Signed: Carollee Leitz MSN, CNM 11/09/2020,  8:31 AM

## 2020-11-11 LAB — SURGICAL PATHOLOGY

## 2020-11-11 NOTE — Anesthesia Postprocedure Evaluation (Signed)
Anesthesia Post Note  Patient: Connie Buchanan  Procedure(s) Performed: REPEAT CESAREAN SECTION (N/A )     Patient location during evaluation: PACU Anesthesia Type: Spinal Level of consciousness: awake Pain management: pain level controlled Vital Signs Assessment: post-procedure vital signs reviewed and stable Respiratory status: spontaneous breathing Cardiovascular status: stable Postop Assessment: no headache, no backache, spinal receding, patient able to bend at knees and no apparent nausea or vomiting Anesthetic complications: no   No complications documented.  Last Vitals:  Vitals:   11/09/20 0539 11/09/20 0813  BP: 123/72 118/71  Pulse: 90 78  Resp: 15 18  Temp: 36.6 C 36.7 C  SpO2: 99% 98%    Last Pain:  Vitals:   11/09/20 1340  TempSrc:   PainSc: 5    Pain Goal: Patients Stated Pain Goal: 3 (11/09/20 0539)                 Caren Macadam

## 2020-11-27 ENCOUNTER — Encounter (HOSPITAL_COMMUNITY): Payer: Self-pay | Admitting: Obstetrics and Gynecology

## 2021-07-26 IMAGING — US US MFM OB LIMITED
1 series · 15 of 28 positions shown · non-contrast
Comparison: none

[Series 1: us mfm ob limited · 28 acquisitions, 15 frames shown]
[im 1/28]
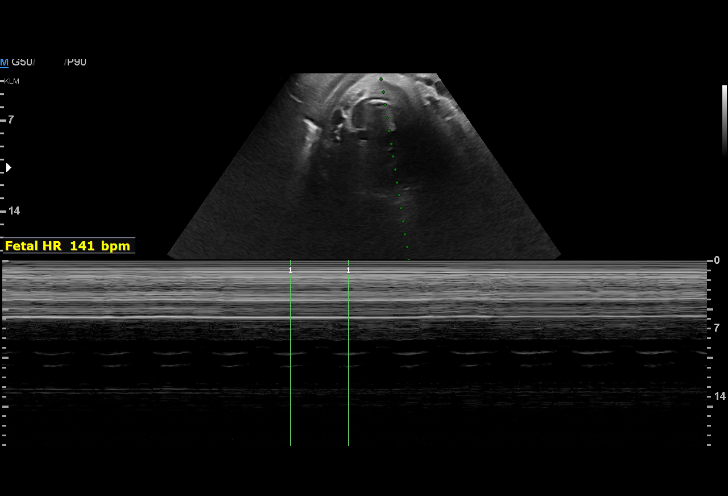
[im 3/28]
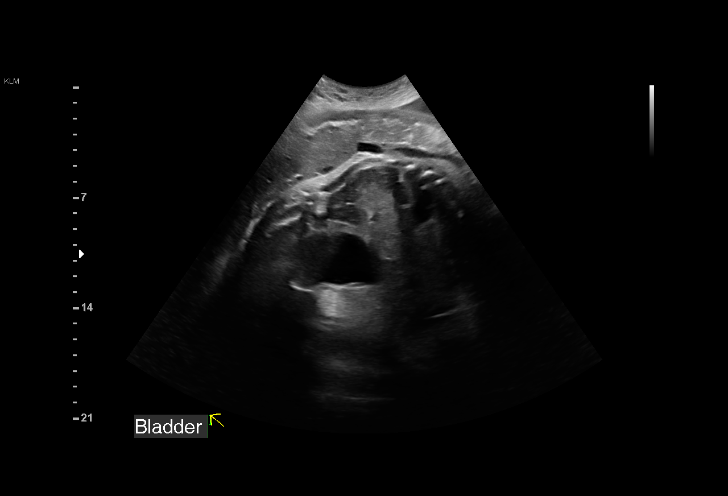
[im 5/28]
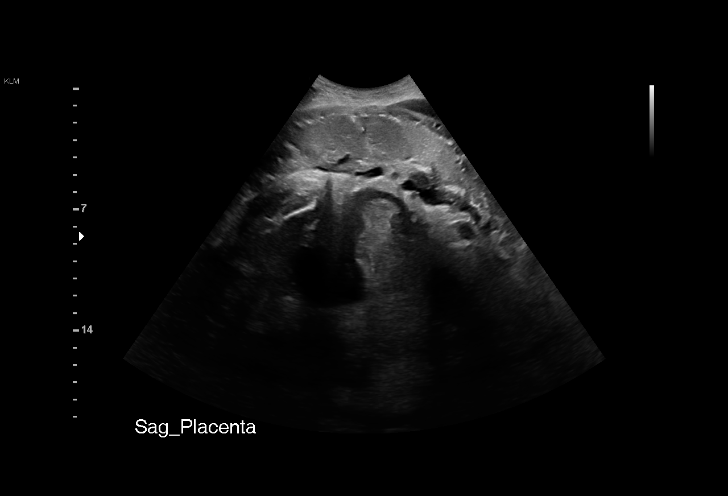
[im 7/28]
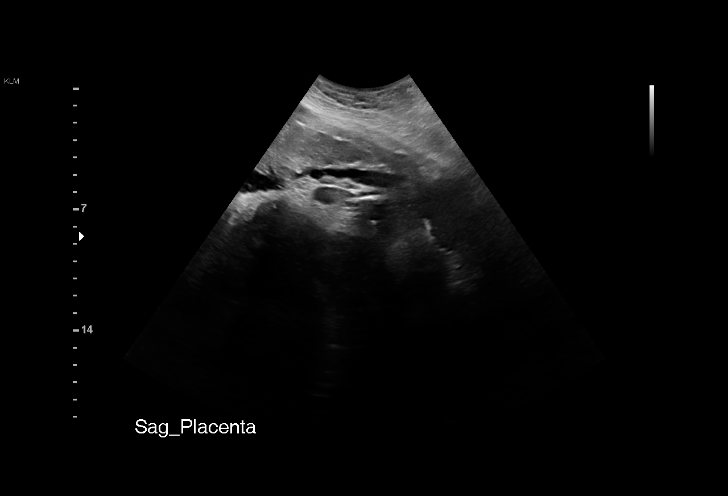
[im 9/28]
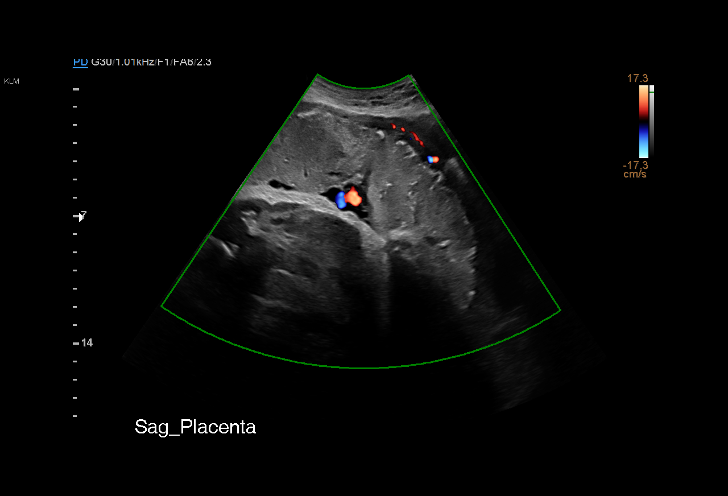
[im 11/28]
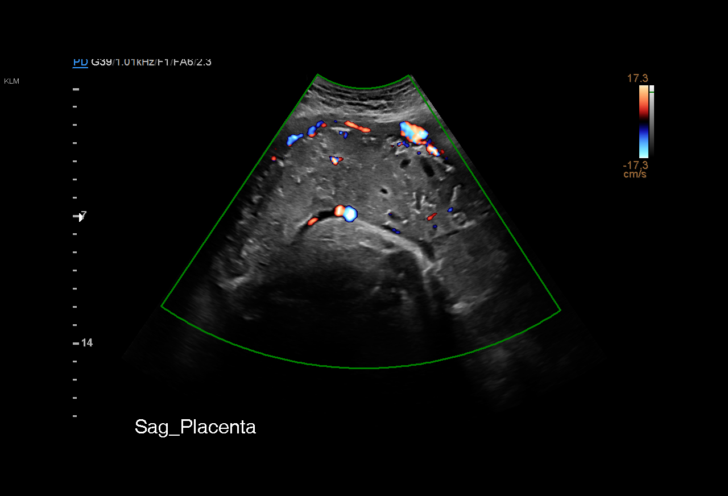
[im 13/28]
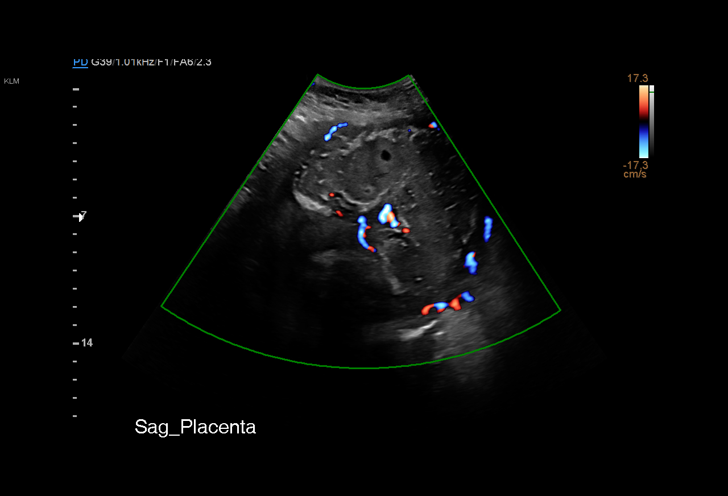
[im 15/28]
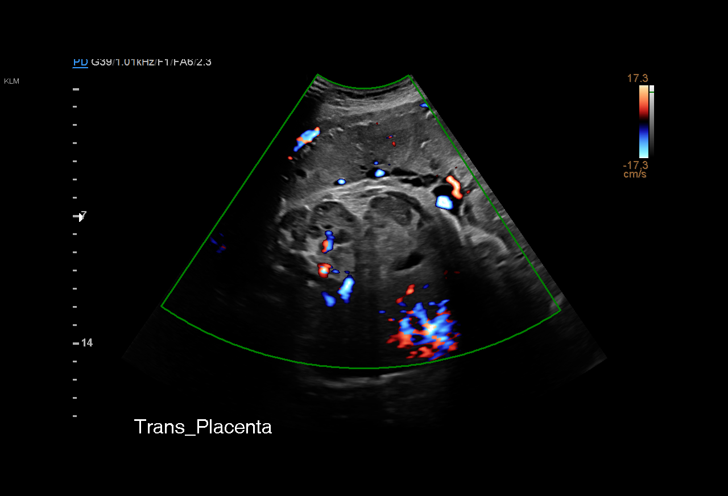
[im 16/28]
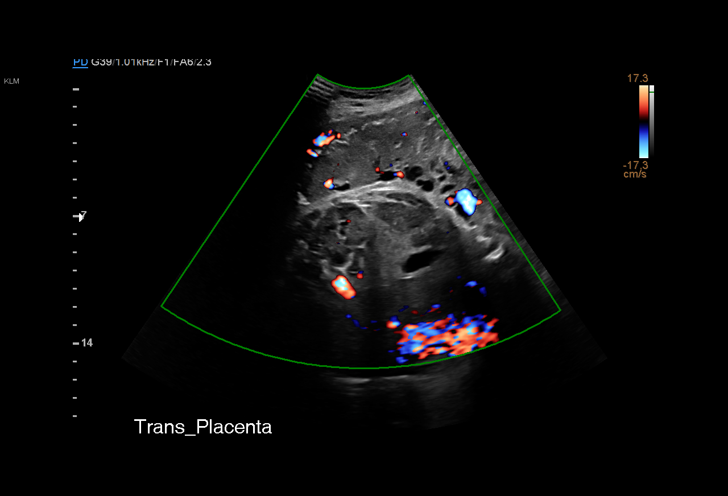
[im 18/28]
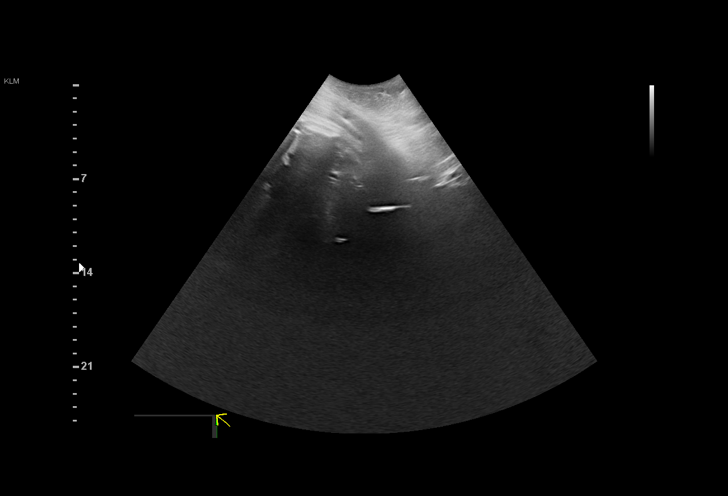
[im 20/28]
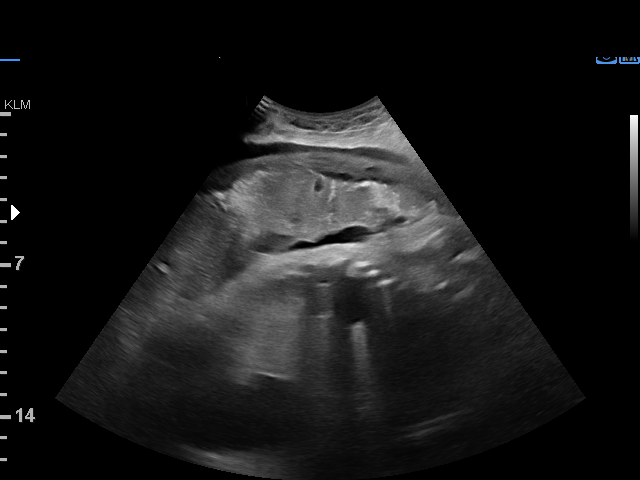
[im 22/28]
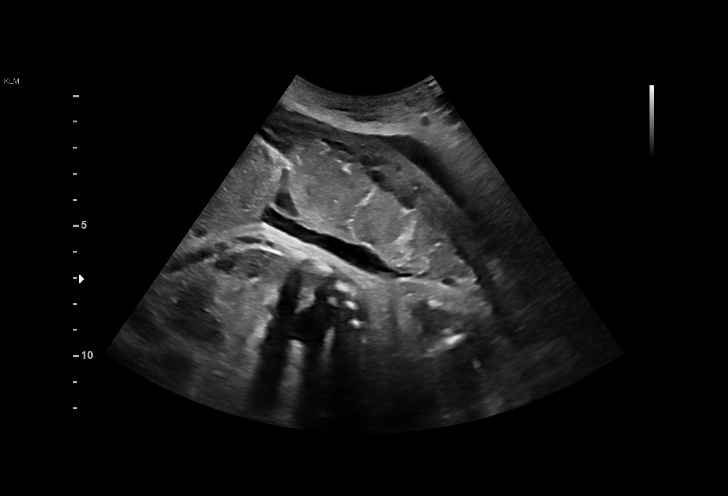
[im 24/28]
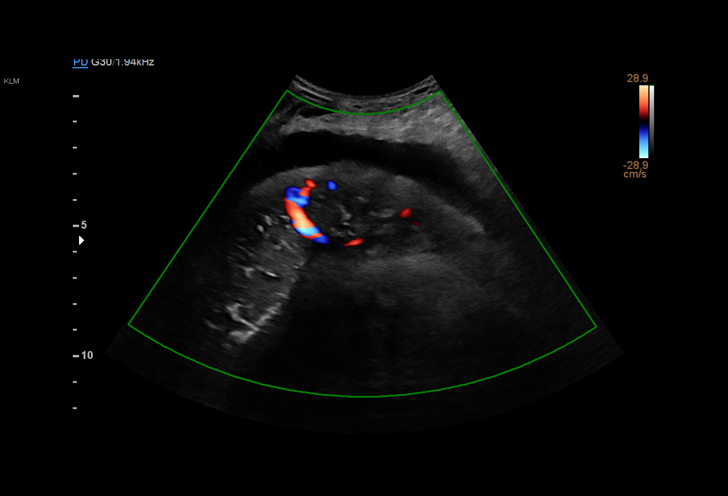
[im 26/28]
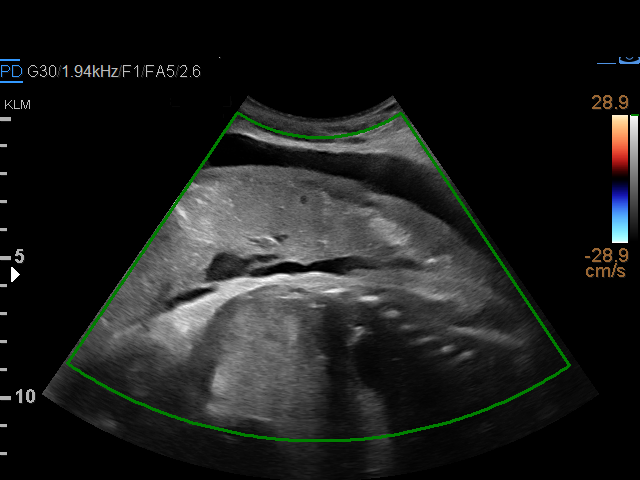
[im 28/28]
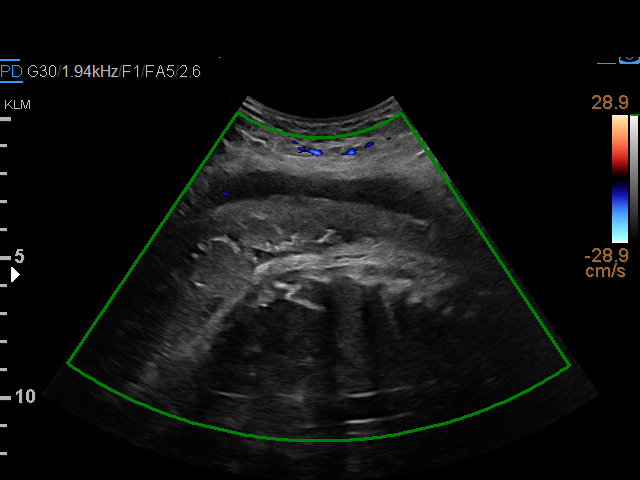

[15 of 28 positions shown; findings below may reference images not displayed]

[REDACTED]
                   YAMAGUCHI CNM                              [HOSPITAL]

Indications

 Pelvic pain affecting pregnancy in third
 trimester (top of stomach)
 39 weeks gestation of pregnancy
 Premature rupture of membranes - leaking
 fluid
Fetal Evaluation

 Num Of Fetuses:          1
 Fetal Heart Rate(bpm):   141
 Cardiac Activity:        Observed
 Presentation:            Cephalic
 Placenta:                Anterior

 Amniotic Fluid
 AFI FV:      Anhydramnios

 Comment:    Fluid seen at the area of pain, above the myometrium. placenta
             seen attached to myometrium.
OB History

 Gravidity:    2         Term:   1        Prem:   0        SAB:   0
 TOP:          0       Ectopic:  0        Living: 1
Gestational Age

 Clinical EDD:  39w 0d                                        EDD:   11/14/20
 Best:          39w 0d     Det. By:  Clinical EDD             EDD:   11/14/20
Anatomy

 Stomach:               Appears normal, left   Bladder:                Appears normal
                        sided
Cervix Uterus Adnexa

 Cervix
 Not visualized (advanced GA >24wks)
Impression

 Limited exam with known premature rupture of membranes
 Minimal amniottic fluid observed consistent with
 anhydramnios

 Concern for possible uterine rupture due to maternal
 abdominal pain.

 There appears to be free fluid between uterine wall and
 subcutaneous tissue, uncertain if it is free fluid or a
 hemoperitoneum.
 There are no intraperitoneal fetal parts or amniotic sac
 observed.
 A uterine wall defect is not observed.

 A uterine rupture is difficult to be identified via ultrasound.

 Uterine rupture is a clinical diagnosis including acute
 maternal abdominal pain, hemodynamic instability and fetal
 heart rate abnormalities with 90% of cases involving a prior
 cesarean scar however, if the clinical picture is uncertain an
 MRI if a better imaging modality to assist in the diagnosis.
Recommendations

 Clinical correlation recommended
 Consider MRI if clinical picture is stable and suspicion is high
 for uterine rupture

## 2023-01-03 ENCOUNTER — Telehealth: Payer: Managed Care, Other (non HMO) | Admitting: Family

## 2023-01-03 DIAGNOSIS — U071 COVID-19: Secondary | ICD-10-CM | POA: Diagnosis not present

## 2023-01-03 MED ORDER — BENZONATATE 100 MG PO CAPS: 100.0000 mg | ORAL_CAPSULE | Freq: Three times a day (TID) | ORAL | 0 refills | Status: AC | PRN

## 2023-01-03 MED ORDER — MOLNUPIRAVIR EUA 200MG CAPSULE: 4.0000 | ORAL_CAPSULE | Freq: Two times a day (BID) | ORAL | 0 refills | Status: AC

## 2023-01-03 NOTE — Progress Notes (Signed)
Virtual Visit Consent   [name], you are scheduled for a virtual visit with a [facility] provider today. Just as with appointments in the office, your consent must be obtained to participate. Your consent will be active for this visit and any virtual visit you may have with one of our providers in the next 365 days. If you have a MyChart account, a copy of this consent can be sent to you electronically.  As this is a virtual visit, video technology does not allow for your provider to perform a traditional examination. This may limit your provider's ability to fully assess your condition. If your provider identifies any concerns that need to be evaluated in person or the need to arrange testing (such as labs, EKG, etc.), we will make arrangements to do so. Although advances in technology are sophisticated, we cannot ensure that it will always work on either your end or our end. If the connection with a video visit is poor, the visit may have to be switched to a telephone visit. With either a video or telephone visit, we are not always able to ensure that we have a secure connection.  By engaging in this virtual visit, you consent to the provision of healthcare and authorize for your insurance to be billed (if applicable) for the services provided during this visit. Depending on your insurance coverage, you may receive a charge related to this service.  I need to obtain your verbal consent now. Are you willing to proceed with your visit today? Connie Buchanan has provided verbal consent on 01/03/2023 for a virtual visit (video or telephone). Connie Dun, FNP  Date: 01/03/2023 9:31 AM  Virtual Visit via Video Note   I, Connie Buchanan, connected with  Connie Buchanan  ([id number], 1991-11-04) on 01/03/23 at  9:30 AM EST by a video-enabled telemedicine application and verified that I am speaking with the correct person using two identifiers.  Location: Patient: Virtual Visit Location Patient:  Home Provider: Virtual Visit Location Provider: Home Office   I discussed the limitations of evaluation and management by telemedicine and the availability of in person appointments. The patient expressed understanding and agreed to proceed.    History of Present Illness: Connie Buchanan is a 32 y.o. who identifies as a female who was assigned female at birth, and is being seen today for [place]. She reports her symptoms started two days and tested positive yesterday.   HPI: URI  This is a new problem. The current episode started in the past 7 days. The problem has been unchanged. Associated symptoms include congestion, coughing, headaches, sinus pain, sneezing, a sore throat and wheezing. Pertinent negatives include no ear pain, nausea or vomiting. She has tried decongestant for the symptoms. The treatment provided mild relief.    Problems:  Patient Active Problem List   Diagnosis Date Noted   S/P cesarean section 11/07/2020   H/O cesarean section complicating pregnancy 70/62/3762   Normal labor 06/17/2018   Tobacco abuse 06/13/2015    Allergies: No Known Allergies Medications:  Current Outpatient Medications:    benzonatate (TESSALON PERLES) 100 MG capsule, Take 1 capsule (100 mg total) by mouth 3 (three) times daily as needed., Disp: 20 capsule, Rfl: 0   molnupiravir EUA (LAGEVRIO) 200 mg CAPS capsule, Take 4 capsules (800 mg total) by mouth 2 (two) times daily for 5 days., Disp: 40 capsule, Rfl: 0   acetaminophen (TYLENOL) 500 MG tablet, Take 500 mg by mouth every 6 (six) hours as needed., Disp: ,  Rfl:    ESOMEPRAZOLE MAGNESIUM PO, Take by mouth daily. , Disp: , Rfl:    Prenatal Vit-Fe Fumarate-FA (PRENATAL MULTIVITAMIN) TABS tablet, Take 1 tablet by mouth daily at 12 noon., Disp: , Rfl:   Observations/Objective: Patient is well-developed, well-nourished in no acute distress.  Resting comfortably  at home.  Head is normocephalic, atraumatic.  No labored breathing.  Speech is clear  and coherent with logical content.  Patient is alert and oriented at baseline.    Assessment and Plan: 1. COVID-19 - molnupiravir EUA (LAGEVRIO) 200 mg CAPS capsule; Take 4 capsules (800 mg total) by mouth 2 (two) times daily for 5 days.  Dispense: 40 capsule; Refill: 0 - benzonatate (TESSALON PERLES) 100 MG capsule; Take 1 capsule (100 mg total) by mouth 3 (three) times daily as needed.  Dispense: 20 capsule; Refill: 0  COVID positive, rest, force fluids, tylenol as needed, Quarantine for at least 5 days and you are fever free, then must wear a mask out in public from day 2-02, report any worsening symptoms such as increased shortness of breath, swelling, or continued high fevers. Possible adverse effects discussed with antivirals.    Follow Up Instructions: I discussed the assessment and treatment plan with the patient. The patient was provided an opportunity to ask questions and all were answered. The patient agreed with the plan and demonstrated an understanding of the instructions.  A copy of instructions were sent to the patient via MyChart unless otherwise noted below.     The patient was advised to call back or seek an in-person evaluation if the symptoms worsen or if the condition fails to improve as anticipated.  Time:  I spent 6 minutes with the patient via telehealth technology discussing the above problems/concerns.    Connie Dun, FNP

## 2023-11-01 ENCOUNTER — Ambulatory Visit (INDEPENDENT_AMBULATORY_CARE_PROVIDER_SITE_OTHER): Payer: Managed Care, Other (non HMO)

## 2023-11-01 ENCOUNTER — Ambulatory Visit (INDEPENDENT_AMBULATORY_CARE_PROVIDER_SITE_OTHER): Payer: Managed Care, Other (non HMO) | Admitting: Podiatry

## 2023-11-01 ENCOUNTER — Encounter: Payer: Self-pay | Admitting: Podiatry

## 2023-11-01 DIAGNOSIS — M79671 Pain in right foot: Secondary | ICD-10-CM | POA: Diagnosis not present

## 2023-11-01 DIAGNOSIS — M79672 Pain in left foot: Secondary | ICD-10-CM

## 2023-11-01 DIAGNOSIS — M722 Plantar fascial fibromatosis: Secondary | ICD-10-CM | POA: Diagnosis not present

## 2023-11-01 MED ORDER — MELOXICAM 15 MG PO TABS: 15.0000 mg | ORAL_TABLET | Freq: Every day | ORAL | 0 refills | Status: AC

## 2023-11-01 NOTE — Patient Instructions (Signed)

## 2023-11-01 NOTE — Progress Notes (Signed)
  Subjective:  Patient ID: Connie Buchanan, female    DOB: 1991-11-09,   MRN: [id number]  Chief Complaint  Patient presents with   Foot Pain    Pt present for bil heel pain     32 y.o. female presents for concern of bilateral heel pain that has been ongoing for about a month. Relates first steps in the morning are painful as well as after being on her feet for 10 hour shifts at [employer]. Relates she has tried some inserts that help but still getting pain. Denies any other treatments.  Relates left worse than right.  . Denies any other pedal complaints. Denies n/v/f/c.   Past Medical History:  Diagnosis Date   Bartholin gland cyst    History of asthma     Objective:  Physical Exam: Vascular: DP/PT pulses 2/4 bilateral. CFT <3 seconds. Normal hair growth on digits. No edema.  Skin. No lacerations or abrasions bilateral feet.  Musculoskeletal: MMT 5/5 bilateral lower extremities in DF, PF, Inversion and Eversion. Deceased ROM in DF of ankle joint. Tender to the medial calcaneal tubercle bilaterally but left worse.  Marland Kitchen No pain with achilles, PT or arch. No pain with calcaneal squeeze.  Neurological: Sensation intact to light touch.   Assessment:   1. Bilateral plantar fasciitis      Plan:  Patient was evaluated and treated and all questions answered. Discussed plantar fasciitis with patient.  X-rays reviewed and discussed with patient. No acute fractures or dislocations noted. Mnimal spurring noted at inferior calcaneus.  Discussed treatment options including, ice, NSAIDS, supportive shoes, bracing, and stretching. Stretching exercises provided to be done on a daily basis.   Prescription for meloxicam provided and sent to pharmacy. CMP reviewed and kidney function appropriate.  PF brace dispensed bilateral.    Follow-up 6 weeks or sooner if any problems arise. In the meantime, encouraged to call the office with any questions, concerns, change in symptoms.       Louann Sjogren, DPM

## 2023-12-13 ENCOUNTER — Ambulatory Visit: Payer: Managed Care, Other (non HMO) | Admitting: Podiatry

## 2024-01-10 ENCOUNTER — Ambulatory Visit: Payer: Managed Care, Other (non HMO) | Admitting: Podiatry
